# Patient Record
Sex: Female | Born: 1955
Health system: Southern US, Community
[De-identification: ages and names within clinical notes are randomized; demographics above are authoritative.]

## PROBLEM LIST (undated history)

## (undated) ENCOUNTER — Emergency Department (HOSPITAL_BASED_OUTPATIENT_CLINIC_OR_DEPARTMENT_OTHER): Admission: EM | Payer: BLUE CROSS/BLUE SHIELD | Source: Home / Self Care

## (undated) DIAGNOSIS — R0989 Other specified symptoms and signs involving the circulatory and respiratory systems: Secondary | ICD-10-CM

## (undated) DIAGNOSIS — K644 Residual hemorrhoidal skin tags: Secondary | ICD-10-CM

## (undated) DIAGNOSIS — I480 Paroxysmal atrial fibrillation: Secondary | ICD-10-CM

## (undated) DIAGNOSIS — Z8709 Personal history of other diseases of the respiratory system: Secondary | ICD-10-CM

## (undated) DIAGNOSIS — Z973 Presence of spectacles and contact lenses: Secondary | ICD-10-CM

## (undated) HISTORY — PX: COLONOSCOPY: SHX174

## (undated) HISTORY — PX: CARDIAC ELECTROPHYSIOLOGY STUDY AND ABLATION: SHX1294

---

## 2011-03-21 ENCOUNTER — Encounter (INDEPENDENT_AMBULATORY_CARE_PROVIDER_SITE_OTHER): Payer: Self-pay | Admitting: General Surgery

## 2011-03-21 ENCOUNTER — Ambulatory Visit (INDEPENDENT_AMBULATORY_CARE_PROVIDER_SITE_OTHER): Payer: BC Managed Care – PPO | Admitting: General Surgery

## 2011-03-21 DIAGNOSIS — K648 Other hemorrhoids: Secondary | ICD-10-CM

## 2011-03-21 NOTE — Progress Notes (Signed)
Julia Carr is a 55 y.o. female.    Chief Complaint  Patient presents with  . Follow-up    hems    HPI HPI Pt is doing much better after hemorrhoids injected.  She has also been using the hydrocortisone cream 2x/day.  Her itching has been doing better.  She is no longer having trouble cleaning the area as well.  She had immediate relief after the injection.  Past Medical History  Diagnosis Date  . Hemorrhoids     History reviewed. No pertinent past surgical history.  History reviewed. No pertinent family history.  Social History History  Substance Use Topics  . Smoking status: Former Smoker -- 0.5 packs/day  . Smokeless tobacco: Not on file  . Alcohol Use:     Allergies no known allergies  No current outpatient prescriptions on file.    Review of Systems ROS Otherwise negative x11  Physical Exam Physical Exam  Constitutional: She is oriented to person, place, and time. She appears well-developed and well-nourished. No distress.  HENT:  Head: Normocephalic and atraumatic.  Eyes: Pupils are equal, round, and reactive to light. No scleral icterus.  Genitourinary:       Pt has small circumferential external hemorrhoids that are not inflamed except posteriorly where there is a large skin tag that is pedunculated.  Musculoskeletal: Normal range of motion.  Neurological: She is alert and oriented to person, place, and time. Coordination normal.  Skin: Skin is warm and dry. No rash noted. She is not diaphoretic. No erythema. No pallor.  Psychiatric: She has a normal mood and affect. Her behavior is normal. Judgment and thought content normal.     There were no vitals taken for this visit.  Assessment/Plan Internal and external hemorrhoids with large skin tag posteriorly. Follow up for office surgery to excise large posterior skin tag/hemorrhoid.    Andree Golphin 03/21/2011, 12:33 PM

## 2011-04-17 ENCOUNTER — Ambulatory Visit (INDEPENDENT_AMBULATORY_CARE_PROVIDER_SITE_OTHER): Payer: BC Managed Care – PPO | Admitting: General Surgery

## 2012-01-11 ENCOUNTER — Encounter (HOSPITAL_BASED_OUTPATIENT_CLINIC_OR_DEPARTMENT_OTHER): Payer: Self-pay

## 2012-01-11 ENCOUNTER — Emergency Department (HOSPITAL_BASED_OUTPATIENT_CLINIC_OR_DEPARTMENT_OTHER)
Admission: EM | Admit: 2012-01-11 | Discharge: 2012-01-11 | Disposition: A | Discharge status: Home / Self Care | Payer: BC Managed Care – PPO | Attending: Emergency Medicine | Admitting: Emergency Medicine

## 2012-01-11 DIAGNOSIS — Y93G1 Activity, food preparation and clean up: Secondary | ICD-10-CM | POA: Insufficient documentation

## 2012-01-11 DIAGNOSIS — W260XXA Contact with knife, initial encounter: Secondary | ICD-10-CM | POA: Insufficient documentation

## 2012-01-11 DIAGNOSIS — Y998 Other external cause status: Secondary | ICD-10-CM | POA: Insufficient documentation

## 2012-01-11 DIAGNOSIS — Z87891 Personal history of nicotine dependence: Secondary | ICD-10-CM | POA: Insufficient documentation

## 2012-01-11 DIAGNOSIS — S61209A Unspecified open wound of unspecified finger without damage to nail, initial encounter: Secondary | ICD-10-CM | POA: Insufficient documentation

## 2012-01-11 DIAGNOSIS — S61211A Laceration without foreign body of left index finger without damage to nail, initial encounter: Secondary | ICD-10-CM

## 2012-01-11 NOTE — ED Notes (Signed)
Pt presents with laceration to the left index finger.  Pt states that she was making lunch and lacerated the finger with a knife. Tetanus utd.

## 2012-01-11 NOTE — ED Provider Notes (Signed)
Medical screening examination/treatment/procedure(s) were performed by non-physician practitioner and as supervising physician I was immediately available for consultation/collaboration.   Sulma Ruffino, MD 01/11/12 1612 

## 2012-01-11 NOTE — Discharge Instructions (Signed)
Tissue Adhesive Wound Care A wound can be repaired by using tissue adhesive. Tissue adhesive holds the skin together and allows faster healing. It forms a strong bond on the skin in about 1 minute and reaches its full strength in about 2 or 3 minutes. The adhesive disappears naturally while healing. Follow up is required if your caregiver wants to recheck for infection and to make sure your wound is healing properly.  You may need a tetanus shot if:  You cannot remember when you had your last tetanus shot.   You have never had a tetanus shot.   The injury broke your skin.  If you got a tetanus shot, your arm may swell, get red, and feel warm to the touch. This is common and not a problem. If you need a tetanus shot and you choose not to have one, there is a rare chance of getting tetanus. Sickness from tetanus can be serious. HOME CARE INSTRUCTIONS   Only take over-the-counter or prescription medicines for pain, discomfort, or fever as directed by your caregiver.   Showers are allowed. Do not soak the area containing the tissue adhesive. Do not take baths, swim, or use hot tubs. Do not use any soaps or ointments on the wound until it has healed.   If a bandage (dressing) has been applied, follow your caregiver's instructions for how often to change the dressing.   Keep the dressing dry if one has been applied.   Do not scratch, pick, or rub the adhesive.   Do not place tape over the adhesive. The adhesive could come off when pulling the tape off.   Protect the wound from further injury until it is healed.   Protect the wound from sun and tanning bed exposure while it is healing and for several weeks after healing.   Keep all follow-up appointments as directed by your caregiver.  SEEK IMMEDIATE MEDICAL CARE IF:   Your wound becomes red, swollen, hot, or tender.   You have increasing pain in the wound.   You have a red streak that goes away from the wound.   You have pus coming  from the wound.   You have a fever.   You have shaking chills.   There is a bad smell coming from the wound.   The wound or adhesive breaks open.  MAKE SURE YOU:   Understand these instructions.   Will watch your condition.   Will get help right away if you are not doing well or get worse.  Document Released: 02/11/2001 Document Revised: 08/07/2011 Document Reviewed: 12/22/2010 Exit[employer] Patient Information 2012 [employer]. 

## 2012-01-11 NOTE — ED Provider Notes (Signed)
History     CSN: [id number]  Arrival date & time 01/11/12  1224   First MD Initiated Contact with Patient 01/11/12 1245      Chief Complaint  Patient presents with  . Laceration    (Consider location/radiation/quality/duration/timing/severity/associated sxs/prior treatment) Patient is a 56 y.o. female presenting with skin laceration. The history is provided by the patient. No language interpreter was used.  Laceration  The incident occurred less than 1 hour ago. Size: 0.5. The laceration mechanism is unknown.The patient is experiencing no pain. She reports no foreign bodies present. Her tetanus status is UTD.    Past Medical History  Diagnosis Date  . Hemorrhoids   . Pneumothorax     History reviewed. No pertinent past surgical history.  History reviewed. No pertinent family history.  History  Substance Use Topics  . Smoking status: Former Smoker -- 0.5 packs/day  . Smokeless tobacco: Never Used  . Alcohol Use: No    OB History    Grav Para Term Preterm Abortions TAB SAB Ect Mult Living                  Review of Systems  Skin: Positive for wound.  All other systems reviewed and are negative.    Allergies  Latex  Home Medications   Current Outpatient Rx  Name Route Sig Dispense Refill  . FLAXSEED OIL PO Oral Take by mouth.      . MULTIVITAMIN PO Oral Take by mouth.        BP 108/75  Pulse 60  Temp(Src) 97.7 F (36.5 C) (Oral)  Resp 20  Ht 5\' 8"  (1.727 m)  Wt 150 lb (68.04 kg)  BMI 22.81 kg/m2  SpO2 100%  Physical Exam  Vitals reviewed. Constitutional: She is oriented to person, place, and time. She appears well-developed and well-nourished.  Musculoskeletal: She exhibits tenderness.  Neurological: She is alert and oriented to person, place, and time. She has normal reflexes.  Skin: Skin is dry.       5mm  Psychiatric: She has a normal mood and affect.    ED Course  LACERATION REPAIR Date/Time: 01/11/2012 1:28 PM Performed by: Elson Areas Authorized by: Elson Areas Consent: Verbal consent not obtained. Consent given by: patient Patient understanding: patient does not state understanding of the procedure being performed Time out: Immediately prior to procedure a "time out" was called to verify the correct patient, procedure, equipment, support staff and site/side marked as required. Laceration length: 0.5 cm Foreign bodies: no foreign bodies Tendon involvement: none Nerve involvement: none Preparation: Patient was prepped and draped in the usual sterile fashion. Skin closure: glue Approximation: close Patient tolerance: Patient tolerated the procedure well with no immediate complications.   (including critical care time)  Labs Reviewed - No data to display No results found.   No diagnosis found.    MDM          Elson Areas, PA 01/11/12 1330

## 2012-07-14 LAB — HM DEXA SCAN

## 2012-07-14 LAB — HM PAP SMEAR: HM Pap smear: NORMAL

## 2012-07-14 LAB — HM MAMMOGRAPHY: HM Mammogram: NORMAL

## 2014-05-13 ENCOUNTER — Other Ambulatory Visit: Payer: Self-pay | Admitting: Internal Medicine

## 2014-07-10 ENCOUNTER — Encounter: Payer: Self-pay | Admitting: Family

## 2014-07-10 ENCOUNTER — Ambulatory Visit (INDEPENDENT_AMBULATORY_CARE_PROVIDER_SITE_OTHER): Payer: BC Managed Care – PPO | Admitting: Family

## 2014-07-10 VITALS — BP 122/82 | HR 48 | Temp 97.7°F | Resp 18 | Ht 68.0 in | Wt 142.8 lb

## 2014-07-10 DIAGNOSIS — R634 Abnormal weight loss: Secondary | ICD-10-CM

## 2014-07-10 DIAGNOSIS — Z8709 Personal history of other diseases of the respiratory system: Secondary | ICD-10-CM

## 2014-07-10 DIAGNOSIS — I4891 Unspecified atrial fibrillation: Secondary | ICD-10-CM

## 2014-07-10 NOTE — Patient Instructions (Signed)
Please schedule your fasting physical at the front desk.   Welcome to [facility]!

## 2014-07-10 NOTE — Progress Notes (Signed)
Subjective:    Patient ID: Julia Carr, female    DOB: 1955/10/18, 58 y.o.   MRN: [id number] Coral12  HPI  Ms. Julia Carr is a 58 year old female who presents today to establish care.   Atrial Flutter/Fib- reports that she was admitted to [facility] x 4 nights She is maintained on amiodarone, diltiazem.  She is followed by Dr. Clydie Braunavid Fitzgerald for cardiology.  She is scheduled for cardiac ablation.    History of pneumothorax- occurred spontaneous and had a chest tube placed overnight. No issues since that time.      Review of Systems  Constitutional:       Reports 10 pound weight loss. Reports thyroid studies were negative  HENT: Negative for hearing loss and rhinorrhea.   Eyes: Negative for visual disturbance.  Respiratory: Negative for cough.   Cardiovascular: Negative for chest pain.       Intermittent palpitations  Gastrointestinal: Negative for nausea, vomiting and diarrhea.       Occasional gerd symptoms.  Genitourinary: Negative for dysuria, frequency and hematuria.  Musculoskeletal: Negative for myalgias and arthralgias.  Skin: Negative for rash.  Neurological: Negative for headaches.  Hematological: Negative for adenopathy.  Psychiatric/Behavioral:       Denies depression/anxiety   Past Medical History  Diagnosis Date  . Hemorrhoids   . Pneumothorax   . History of chicken pox   . Atrial fibrillation     History   Social History  . Marital Status: Single    Spouse Name: N/A    Number of Children: N/A  . Years of Education: N/A   Occupational History  . Not on file.   Social History Main Topics  . Smoking status: Former Smoker -- 0.50 packs/day  . Smokeless tobacco: Never Used  . Alcohol Use: No  . Drug Use: No  . Sexual Activity: Not on file   Other Topics Concern  . 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    One dog    Past Surgical History   Procedure Laterality Date  . Chest tube insertion      Family History  Problem Relation Age of Onset  . Cancer Mother     ovarian  . Diabetes Mother     type II  . Alcohol abuse Father   . Hypertension Father   . Heart murmur Sister   . Cancer Paternal Aunt     breast  . Cancer Paternal Grandmother     breast  . Heart disease Paternal Grandmother   . Alcohol abuse Paternal Grandfather     Allergies  Allergen Reactions  . Latex Hives    Current Outpatient Prescriptions on File Prior to Visit  Medication Sig Dispense Refill  . Flaxseed, Linseed, (FLAXSEED OIL PO) Take by mouth.      . Multiple Vitamin (MULTIVITAMIN PO) Take by mouth.       No current facility-administered medications on file prior to visit.    BP 122/82 mmHg  Pulse 48  Temp(Src) 97.7 F (36.5 C) (Oral)  Resp 18  Ht 5\' 8"  (1.727 m)  Wt 142 lb 12.8 oz (64.774 kg)  BMI 21.72 kg/m2  SpO2 100%       Objective:   Physical Exam  Constitutional: She is oriented to person, place, and time. She appears well-developed and well-nourished. No distress.  HENT:  Head: Normocephalic  and atraumatic.  Right Ear: Tympanic membrane and ear canal normal.  Left Ear: Tympanic membrane and ear canal normal.  Mouth/Throat: No oropharyngeal exudate, posterior oropharyngeal edema or posterior oropharyngeal erythema.  Cardiovascular: Normal rate and regular rhythm.   No murmur heard. Pulmonary/Chest: Effort normal and breath sounds normal. No respiratory distress. She has no wheezes. She has no rales. She exhibits no tenderness.  Musculoskeletal: She exhibits no edema.  Neurological: She is alert and oriented to person, place, and time.  Skin: Skin is warm and dry.  Psychiatric: She has a normal mood and affect. Her behavior is normal. Judgment and thought content normal.          Assessment & Plan:

## 2014-07-13 DIAGNOSIS — I4891 Unspecified atrial fibrillation: Secondary | ICD-10-CM

## 2014-07-13 DIAGNOSIS — Z8709 Personal history of other diseases of the respiratory system: Secondary | ICD-10-CM | POA: Insufficient documentation

## 2014-07-13 DIAGNOSIS — R634 Abnormal weight loss: Secondary | ICD-10-CM

## 2014-07-13 HISTORY — DX: Unspecified atrial fibrillation: I48.91

## 2014-07-13 HISTORY — DX: Abnormal weight loss: R63.4

## 2014-07-13 NOTE — Assessment & Plan Note (Signed)
Plan to repeat TSH at her upcoming physical.

## 2014-07-13 NOTE — Assessment & Plan Note (Signed)
Rate stable, management by cardiology.  She is currently scheduled for ablation with Dr. Clydie Braunavid Fitzgerald.

## 2014-08-04 ENCOUNTER — Encounter: Payer: Self-pay | Admitting: Family

## 2014-08-04 ENCOUNTER — Ambulatory Visit (INDEPENDENT_AMBULATORY_CARE_PROVIDER_SITE_OTHER): Payer: BC Managed Care – PPO | Admitting: Family

## 2014-08-04 VITALS — BP 110/80 | HR 76 | Temp 97.7°F | Resp 16 | Ht 68.0 in | Wt 142.6 lb

## 2014-08-04 DIAGNOSIS — Z0000 Encounter for general adult medical examination without abnormal findings: Secondary | ICD-10-CM | POA: Insufficient documentation

## 2014-08-04 HISTORY — DX: Encounter for general adult medical examination without abnormal findings: Z00.00

## 2014-08-04 NOTE — Assessment & Plan Note (Signed)
Obtain UA, LFT, CBC, lipid. Continue healthy diet, exercise, refer for mammo and dexa.

## 2014-08-04 NOTE — Progress Notes (Signed)
Subjective:    Patient ID: Julia CoralNancy Carr, female    DOB: Apr 26, 1956, 58 y.o.   MRN: [id number]  HPI  Julia Carr is a 58 yr old female who presents today for cpx.  Immunizations: declines flu shot, tdap <4 yrs ago. Diet: healthy Exercise: active/walks daily Colonoscopy: normal 4/14 advised 5 year follow up. Dexa: due Pap Smear: up to date due next year Mammogram: due    Review of Systems  Constitutional: Negative for unexpected weight change.  HENT: Negative for hearing loss.   Eyes: Negative for visual disturbance.  Respiratory: Negative for shortness of breath.   Cardiovascular: Negative for chest pain.  Gastrointestinal: Negative for nausea, vomiting and diarrhea.  Genitourinary: Negative for dysuria and frequency.  Musculoskeletal: Negative for myalgias and arthralgias.  Skin: Negative for rash.  Hematological: Bruises/bleeds easily.  Psychiatric/Behavioral:       Denies depression/anxiety   Past Medical History  Diagnosis Date  . Hemorrhoids   . Pneumothorax   . History of chicken pox   . Atrial fibrillation     History   Social History  . Marital Status: Single    Spouse Name: N/A    Number of Children: N/A  . Years of Education: N/A   Occupational History  . Not on file.   Social History Main Topics  . Smoking status: Former Smoker -- 0.50 packs/day  . Smokeless tobacco: Never Used  . Alcohol Use: No  . Drug Use: No  . Sexual Activity: Not on file   Other Topics Concern  . 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    One dog    Past Surgical History  Procedure Laterality Date  . Chest tube insertion      Family History  Problem Relation Age of Onset  . Cancer Mother     ovarian  . Diabetes Mother     type II  . Alcohol abuse Father   . Hypertension Father   . Heart murmur Sister   . Cancer Paternal Aunt     breast    . Cancer Paternal Grandmother     breast  . Heart disease Paternal Grandmother   . Alcohol abuse Paternal Grandfather     Allergies  Allergen Reactions  . Latex Hives    Current Outpatient Prescriptions on File Prior to Visit  Medication Sig Dispense Refill  . amiodarone (PACERONE) 200 MG tablet Take 200 mg by mouth daily.    . Flaxseed, Linseed, (FLAXSEED OIL PO) Take by mouth.      Marland Kitchen. lisinopril (PRINIVIL,ZESTRIL) 5 MG tablet Take 5 mg by mouth daily.    . Multiple Vitamin (MULTIVITAMIN PO) Take by mouth.       No current facility-administered medications on file prior to visit.    BP 110/80 mmHg  Pulse 76  Temp(Src) 97.7 F (36.5 C) (Oral)  Resp 16  Ht 5\' 8"  (1.727 m)  Wt 142 lb 9.6 oz (64.683 kg)  BMI 21.69 kg/m2       Objective:   Physical Exam  Physical Exam  Constitutional: She is oriented to person, place, and time. She appears well-developed and well-nourished. No distress.  HENT:  Head: Normocephalic and atraumatic.  Right Ear: Tympanic membrane and ear canal normal.  Left Ear: Tympanic membrane and ear canal normal.  Mouth/Throat: Oropharynx is clear and moist.  Eyes: Pupils  are equal, round, and reactive to light. No scleral icterus.  Neck: Normal range of motion. No thyromegaly present.  Cardiovascular: Normal rate and regular rhythm.   No murmur heard. Pulmonary/Chest: Effort normal and breath sounds normal. No respiratory distress. He has no wheezes. She has no rales. She exhibits no tenderness.  Abdominal: Soft. Bowel sounds are normal. He exhibits no distension and no mass. There is no tenderness. There is no rebound and no guarding.  Musculoskeletal: She exhibits no edema.  Lymphadenopathy:    She has no cervical adenopathy.  Neurological: She is alert and oriented to person, place, and time. She has normal patellar reflexes. She exhibits normal muscle tone. Coordination normal.  Skin: Skin is warm and dry.  Psychiatric: She has a normal mood  and affect. Her behavior is normal. Judgment and thought content normal.  Breasts: Examined lying Right: Without masses, retractions, discharge or axillary adenopathy.  Left: Without masses, retractions, discharge or axillary adenopathy.  Pelvic:  deferred        Assessment & Plan:         Assessment & Plan:

## 2014-08-06 LAB — URINALYSIS
BILIRUBIN URINE: NEGATIVE
HGB URINE DIPSTICK: NEGATIVE
KETONES UR: NEGATIVE
LEUKOCYTES UA: NEGATIVE
NITRITE: NEGATIVE
Specific Gravity, Urine: 1.015 (ref 1.000–1.030)
TOTAL PROTEIN, URINE-UPE24: NEGATIVE
Urine Glucose: NEGATIVE
Urobilinogen, UA: 0.2 — AB (ref 0.0–1.0)
pH: 7 (ref 5.0–8.0)

## 2014-08-06 LAB — CBC WITH DIFFERENTIAL/PLATELET
BASOS ABS: 0 10*3/uL (ref 0.0–0.1)
Basophils Relative: 0.7 % (ref 0.0–3.0)
Eosinophils Absolute: 0.1 10*3/uL (ref 0.0–0.7)
Eosinophils Relative: 1.8 % (ref 0.0–5.0)
HEMATOCRIT: 38.7 % (ref 36.0–46.0)
Hemoglobin: 12.6 g/dL (ref 12.0–15.0)
LYMPHS ABS: 1.3 10*3/uL (ref 0.7–4.0)
Lymphocytes Relative: 28.6 % (ref 12.0–46.0)
MCHC: 32.5 g/dL (ref 30.0–36.0)
MCV: 89.7 fl (ref 78.0–100.0)
Monocytes Absolute: 0.3 10*3/uL (ref 0.1–1.0)
Monocytes Relative: 7.2 % (ref 3.0–12.0)
NEUTROS ABS: 2.7 10*3/uL (ref 1.4–7.7)
Neutrophils Relative %: 61.7 % (ref 43.0–77.0)
PLATELETS: 255 10*3/uL (ref 150.0–400.0)
RBC: 4.32 Mil/uL (ref 3.87–5.11)
RDW: 17.5 % — AB (ref 11.5–15.5)
WBC: 4.4 10*3/uL (ref 4.0–10.5)

## 2014-08-06 LAB — LIPID PANEL
Cholesterol: 146 mg/dL (ref 0–200)
HDL: 51.8 mg/dL (ref >39.00)
LDL CALC: 84 mg/dL (ref 0–99)
NONHDL: 94.2
Total CHOL/HDL Ratio: 3
Triglycerides: 52 mg/dL (ref 0.0–149.0)
VLDL: 10.4 mg/dL (ref 0.0–40.0)

## 2014-08-06 LAB — HEPATIC FUNCTION PANEL
ALBUMIN: 4.3 g/dL (ref 3.5–5.2)
ALT: 24 U/L (ref 0–35)
AST: 21 U/L (ref 0–37)
Alkaline Phosphatase: 61 U/L (ref 39–117)
Bilirubin, Direct: 0.1 mg/dL (ref 0.0–0.3)
TOTAL PROTEIN: 7.4 g/dL (ref 6.0–8.3)
Total Bilirubin: 0.8 mg/dL (ref 0.2–1.2)

## 2014-08-08 ENCOUNTER — Encounter: Payer: Self-pay | Admitting: Family

## 2014-08-15 ENCOUNTER — Ambulatory Visit (HOSPITAL_BASED_OUTPATIENT_CLINIC_OR_DEPARTMENT_OTHER)
Admission: RE | Admit: 2014-08-15 | Discharge: 2014-08-15 | Disposition: A | Discharge status: Home / Self Care | Payer: BC Managed Care – PPO | Source: Ambulatory Visit | Attending: Family | Admitting: Family

## 2014-08-15 DIAGNOSIS — Z1231 Encounter for screening mammogram for malignant neoplasm of breast: Secondary | ICD-10-CM | POA: Insufficient documentation

## 2014-08-15 DIAGNOSIS — Z0000 Encounter for general adult medical examination without abnormal findings: Secondary | ICD-10-CM

## 2014-08-18 ENCOUNTER — Inpatient Hospital Stay: Admission: RE | Admit: 2014-08-18 | Payer: BC Managed Care – PPO | Source: Ambulatory Visit

## 2014-08-21 ENCOUNTER — Telehealth: Payer: Self-pay | Admitting: Family

## 2014-08-21 NOTE — Telephone Encounter (Signed)
Caller name: Galit Relation to pt: self Call back number: [phone]  [facility] Pck to leave message Pharmacy:  Reason for call:   Patient wanting to know if we received lab test results from Dr. Sampson GoonFitzgerald and what labs did they send.

## 2014-08-21 NOTE — Telephone Encounter (Signed)
Julia Carr-- do you have any results on this pt?

## 2014-08-31 NOTE — Telephone Encounter (Signed)
We did received records and I placed them on your desk.

## 2014-09-11 ENCOUNTER — Encounter: Payer: Self-pay | Admitting: Family

## 2014-09-11 NOTE — Telephone Encounter (Signed)
Records [facility] from 01/06/11 through 06/23/13. Did not receive EKG or mammogram results that were noted in records. Spoke with pt re: below request and she states Dr Sampson GoonFitzgerald is her cardiologist and did a full panel of blood tests prior to her CPE here. She reports that it appeared we also did a full panel of labs and she states we would not need to obtain labs from Dr Sampson GoonFitzgerald. She will contact [facility] re: EKG and mammogram results and request them be faxed to us.

## 2014-09-13 ENCOUNTER — Encounter: Payer: Self-pay | Admitting: Family

## 2014-09-13 DIAGNOSIS — M8580 Other specified disorders of bone density and structure, unspecified site: Secondary | ICD-10-CM

## 2014-09-13 HISTORY — DX: Other specified disorders of bone density and structure, unspecified site: M85.80

## 2014-09-15 NOTE — Telephone Encounter (Signed)
EKG and mammogram results received and placed in Julia Carr's yellow folder. JG//CMA

## 2014-09-19 ENCOUNTER — Encounter: Payer: Self-pay | Admitting: Family

## 2014-09-25 ENCOUNTER — Encounter: Payer: Self-pay | Admitting: Family

## 2015-01-01 ENCOUNTER — Telehealth: Payer: Self-pay | Admitting: Family

## 2015-01-01 NOTE — Telephone Encounter (Addendum)
Relation to pt: self  Call back number: [phone]   Reason for call: pt requesting a letter requesting a stand alone work station at work due to the kinks in her neck and back. Please fax to   [phone]

## 2015-01-02 NOTE — Telephone Encounter (Signed)
Left detailed message on cell# that pt will need evaluation in the office before recommendations can be made and to call and schedule appt.

## 2015-01-03 ENCOUNTER — Encounter: Payer: Self-pay | Admitting: Family

## 2015-01-03 ENCOUNTER — Ambulatory Visit (INDEPENDENT_AMBULATORY_CARE_PROVIDER_SITE_OTHER): Payer: BLUE CROSS/BLUE SHIELD | Admitting: Family

## 2015-01-03 VITALS — BP 120/88 | HR 59 | Temp 97.9°F | Resp 16 | Ht 68.0 in | Wt 144.0 lb

## 2015-01-03 DIAGNOSIS — I10 Essential (primary) hypertension: Secondary | ICD-10-CM | POA: Diagnosis not present

## 2015-01-03 DIAGNOSIS — M542 Cervicalgia: Secondary | ICD-10-CM | POA: Insufficient documentation

## 2015-01-03 HISTORY — DX: Essential (primary) hypertension: I10

## 2015-01-03 HISTORY — DX: Cervicalgia: M54.2

## 2015-01-03 NOTE — Assessment & Plan Note (Addendum)
New. I do think an adjustable height desk will be helpful for her and I have provided her with a note for her employer.

## 2015-01-03 NOTE — Progress Notes (Signed)
Pre visit review using our clinic review tool, if applicable. No additional management support is needed unless otherwise documented below in the visit note. 

## 2015-01-03 NOTE — Patient Instructions (Signed)
OK to remain off of lisinopril. Please schedule a follow up appointment in 3 months.

## 2015-01-03 NOTE — Progress Notes (Signed)
   Subjective:    Patient ID: Julia CoralNancy Carr, female    DOB: 10-Dec-1955, 59 y.o.   MRN: [id number]  HPI  Ms. Julia Carr is a 59 yr old female who presents today to discuss neck pain.  Reports that she gets  "pain" at the left neck base/upper shoulder intermittently. Attributes to positioning at her desk at work.  Her employer will provide her with an adjustable height desk, however she needs a note from her provider which she is requesting today.  She is currently off of lisinopril. Feeling well. BP Readings from Last 3 Encounters:  01/03/15 120/88  08/04/14 110/80  07/10/14 122/82  118/78 manual recheck.     Review of Systems See HPI  Past Medical History  Diagnosis Date  . Hemorrhoids   . Pneumothorax   . History of chicken pox   . Atrial fibrillation   . HTN (hypertension)     History   Social History  . Marital Status: Single    Spouse Name: N/A  . Number of Children: N/A  . Years of Education: N/A   Occupational History  . Not on file.   Social History Main Topics  . Smoking status: Former Smoker -- 0.50 packs/day  . Smokeless tobacco: Never Used  . Alcohol Use: No  . Drug Use: No  . Sexual Activity: Not on file   Other Topics Concern  . 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    One dog    Past Surgical History  Procedure Laterality Date  . Chest tube insertion      Family History  Problem Relation Age of Onset  . Cancer Mother     ovarian  . Diabetes Mother     type II  . Alcohol abuse Father   . Hypertension Father   . Heart murmur Sister   . Cancer Paternal Aunt     breast  . Cancer Paternal Grandmother     breast  . Heart disease Paternal Grandmother   . Alcohol abuse Paternal Grandfather     Allergies  Allergen Reactions  . Latex Hives    Current Outpatient Prescriptions on File Prior to Visit  Medication Sig  Dispense Refill  . diltiazem (CARDIZEM CD) 180 MG 24 hr capsule Take 180 mg by mouth daily.    . Flaxseed, Linseed, (FLAXSEED OIL PO) Take by mouth.      . Multiple Vitamin (MULTIVITAMIN PO) Take by mouth.       No current facility-administered medications on file prior to visit.    BP 120/88 mmHg  Pulse 59  Temp(Src) 97.9 F (36.6 C) (Oral)  Resp 16  Ht 5\' 8"  (1.727 m)  Wt 144 lb (65.318 kg)  BMI 21.90 kg/m2  SpO2 99%       Objective:   Physical Exam  Constitutional: She appears well-developed and well-nourished.  Neck: Neck supple.  Cardiovascular: Normal rate, regular rhythm and normal heart sounds.   No murmur heard. Pulmonary/Chest: Effort normal and breath sounds normal. No respiratory distress. She has no wheezes.  Musculoskeletal:  No neck tenderness to palpation  Psychiatric: She has a normal mood and affect. Her behavior is normal. Judgment and thought content normal.          Assessment & Plan:

## 2015-01-03 NOTE — Assessment & Plan Note (Signed)
BP is stable despite discontinuation of lisinopril.  OK to remain off.

## 2015-03-08 ENCOUNTER — Ambulatory Visit: Payer: BLUE CROSS/BLUE SHIELD | Admitting: Medical

## 2015-03-09 ENCOUNTER — Ambulatory Visit: Payer: BLUE CROSS/BLUE SHIELD | Admitting: Medical

## 2015-03-12 ENCOUNTER — Encounter: Payer: Self-pay | Admitting: Medical

## 2015-03-12 ENCOUNTER — Telehealth: Payer: Self-pay | Admitting: Medical

## 2015-03-12 NOTE — Telephone Encounter (Signed)
charge 

## 2015-03-12 NOTE — Telephone Encounter (Signed)
Pt was no show 03/09/15 9:00am, acute appt, pt has not rescheduled, has appt with primary 04/13/15, mailing letter, do you want to charge no show fee?

## 2015-04-13 ENCOUNTER — Ambulatory Visit: Payer: Self-pay | Admitting: Family

## 2015-05-11 ENCOUNTER — Ambulatory Visit: Payer: Self-pay | Admitting: Family

## 2015-07-17 ENCOUNTER — Encounter: Payer: Self-pay | Admitting: Behavioral Health

## 2015-07-17 ENCOUNTER — Telehealth: Payer: Self-pay | Admitting: Behavioral Health

## 2015-07-17 NOTE — Telephone Encounter (Signed)
Unable to reach patient at time of Pre-Visit Call.  Left message for patient to return call when available.    

## 2015-07-17 NOTE — Telephone Encounter (Signed)
Patient returned the call, but declined to go over Pre-visit info. She stated, " this is a waste of my time and I will wait and do this at my appointment tomorrow, thank you".

## 2015-07-18 ENCOUNTER — Other Ambulatory Visit (HOSPITAL_COMMUNITY)
Admission: RE | Admit: 2015-07-18 | Discharge: 2015-07-18 | Disposition: A | Discharge status: Home / Self Care | Payer: BLUE CROSS/BLUE SHIELD | Source: Ambulatory Visit | Attending: Family | Admitting: Family

## 2015-07-18 ENCOUNTER — Encounter: Payer: Self-pay | Admitting: Family

## 2015-07-18 ENCOUNTER — Ambulatory Visit (INDEPENDENT_AMBULATORY_CARE_PROVIDER_SITE_OTHER): Payer: BLUE CROSS/BLUE SHIELD | Admitting: Family

## 2015-07-18 VITALS — BP 110/64 | HR 64 | Temp 97.5°F | Resp 16 | Ht 68.0 in | Wt 146.3 lb

## 2015-07-18 DIAGNOSIS — Z1151 Encounter for screening for human papillomavirus (HPV): Secondary | ICD-10-CM | POA: Diagnosis present

## 2015-07-18 DIAGNOSIS — Z01419 Encounter for gynecological examination (general) (routine) without abnormal findings: Secondary | ICD-10-CM | POA: Diagnosis not present

## 2015-07-18 DIAGNOSIS — Z0000 Encounter for general adult medical examination without abnormal findings: Secondary | ICD-10-CM

## 2015-07-18 LAB — URINALYSIS, ROUTINE W REFLEX MICROSCOPIC
Bilirubin Urine: NEGATIVE
HGB URINE DIPSTICK: NEGATIVE
Ketones, ur: NEGATIVE
LEUKOCYTES UA: NEGATIVE
NITRITE: NEGATIVE
Specific Gravity, Urine: 1.015 (ref 1.000–1.030)
Total Protein, Urine: NEGATIVE
URINE GLUCOSE: NEGATIVE
Urobilinogen, UA: 0.2 (ref 0.0–1.0)
pH: 8 (ref 5.0–8.0)

## 2015-07-18 LAB — TSH: TSH: 2.55 u[IU]/mL (ref 0.35–4.50)

## 2015-07-18 LAB — BASIC METABOLIC PANEL
BUN: 17 mg/dL (ref 6–23)
CALCIUM: 9.7 mg/dL (ref 8.4–10.5)
CO2: 30 mEq/L (ref 19–32)
Chloride: 106 mEq/L (ref 96–112)
Creatinine, Ser: 0.74 mg/dL (ref 0.40–1.20)
GFR: 85.84 mL/min (ref >60.00)
GLUCOSE: 82 mg/dL (ref 70–99)
Potassium: 4.1 mEq/L (ref 3.5–5.1)
SODIUM: 143 meq/L (ref 135–145)

## 2015-07-18 LAB — LIPID PANEL
Cholesterol: 165 mg/dL (ref 0–200)
HDL: 48.1 mg/dL (ref >39.00)
LDL Cholesterol: 99 mg/dL (ref 0–99)
NONHDL: 116.71
TRIGLYCERIDES: 88 mg/dL (ref 0.0–149.0)
Total CHOL/HDL Ratio: 3
VLDL: 17.6 mg/dL (ref 0.0–40.0)

## 2015-07-18 LAB — CBC WITH DIFFERENTIAL/PLATELET
BASOS ABS: 0 10*3/uL (ref 0.0–0.1)
Basophils Relative: 0.5 % (ref 0.0–3.0)
Eosinophils Absolute: 0.1 10*3/uL (ref 0.0–0.7)
Eosinophils Relative: 1.8 % (ref 0.0–5.0)
HCT: 39.5 % (ref 36.0–46.0)
Hemoglobin: 13 g/dL (ref 12.0–15.0)
LYMPHS ABS: 1.7 10*3/uL (ref 0.7–4.0)
Lymphocytes Relative: 34 % (ref 12.0–46.0)
MCHC: 33 g/dL (ref 30.0–36.0)
MCV: 89.3 fl (ref 78.0–100.0)
MONO ABS: 0.5 10*3/uL (ref 0.1–1.0)
MONOS PCT: 9.4 % (ref 3.0–12.0)
NEUTROS PCT: 54.3 % (ref 43.0–77.0)
NRBC: 0 /100{WBCs} (ref 0–4)
Neutro Abs: 2.7 10*3/uL (ref 1.4–7.7)
PLATELETS: 273 10*3/uL (ref 150.0–400.0)
RBC: 4.43 Mil/uL (ref 3.87–5.11)
RDW: 12.8 % (ref 11.5–15.5)
WBC: 5.1 10*3/uL (ref 4.0–10.5)

## 2015-07-18 LAB — HEPATIC FUNCTION PANEL
ALK PHOS: 89 U/L (ref 39–117)
ALT: 21 U/L (ref 0–35)
AST: 23 U/L (ref 0–37)
Albumin: 4.2 g/dL (ref 3.5–5.2)
BILIRUBIN DIRECT: 0.1 mg/dL (ref 0.0–0.3)
BILIRUBIN TOTAL: 0.7 mg/dL (ref 0.2–1.2)
Total Protein: 7.6 g/dL (ref 6.0–8.3)

## 2015-07-18 NOTE — Assessment & Plan Note (Signed)
Encouraged pt to continue healthy diet, exercise.  Declines flu shot and dexa.  Pap performed today.

## 2015-07-18 NOTE — Progress Notes (Signed)
Pre visit review using our clinic review tool, if applicable. No additional management support is needed unless otherwise documented below in the visit note. 

## 2015-07-18 NOTE — Patient Instructions (Addendum)
Keep up the great work with healthy diet and exercise!

## 2015-07-18 NOTE — Addendum Note (Signed)
Addended by: Mervin KungFERGERSON, Brigid Vandekamp A on: 07/18/2015 08:32 AM   Modules accepted: Orders

## 2015-07-18 NOTE — Progress Notes (Signed)
Subjective:    Patient ID: Julia Carr, female    DOB: July 04, 1956, 59 y.o.   MRN: [id number]  HPI  Ms. Julia Carr is a 59 yr old female who presents today for cpx.  Immunizations:  tetanus up to date. Declines flu shot Diet:  Reports healthy diet Exercise: started running  Colonoscopy: 2014- neg for polyps, internal hemorrhoids per pt.  Was told 5 yr follow up.  Dexa: 2013 , declines dexa at this time Pap Smear:  11/13- due, always normal Mammogram: 11/16- done work- awaiting results.    Review of Systems  Constitutional: Negative for unexpected weight change.       Wt Readings from Last 3 Encounters: 07/18/15 : 146 lb 4.8 oz (66.361 kg) 01/03/15 : 144 lb (65.318 kg) 08/04/14 : 142 lb 9.6 oz (64.683 kg)    HENT: Negative for hearing loss and rhinorrhea.   Eyes: Negative for visual disturbance.  Respiratory: Negative for cough and shortness of breath.   Cardiovascular: Negative for chest pain.  Gastrointestinal: Negative for diarrhea, constipation and blood in stool.  Genitourinary: Negative for dysuria and frequency.  Musculoskeletal: Negative for myalgias and arthralgias.  Skin: Negative for rash.  Neurological: Negative for headaches.  Hematological: Negative for adenopathy.  Psychiatric/Behavioral:       Denies depression/anxiety       Past Medical History  Diagnosis Date  . Hemorrhoids   . Pneumothorax   . History of chicken pox   . Atrial fibrillation (HCC)   . HTN (hypertension)     Social History   Social History  . Marital Status: Single    Spouse Name: N/A  . Number of Children: N/A  . Years of Education: N/A   Occupational History  . Not on file.   Social History Main Topics  . Smoking status: Former Smoker -- 0.50 packs/day  . Smokeless tobacco: Never Used  . Alcohol Use: No  . Drug Use: No  . Sexual Activity: Not on file   Other Topics Concern  . 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    One dog    Past Surgical History  Procedure Laterality Date  . Chest tube insertion      Family History  Problem Relation Age of Onset  . Cancer Mother     ovarian  . Diabetes Mother     type II  . Alcohol abuse Father   . Hypertension Father   . Heart murmur Sister   . Cancer Paternal Aunt     breast  . Cancer Paternal Grandmother     breast  . Heart disease Paternal Grandmother   . Alcohol abuse Paternal Grandfather     Allergies  Allergen Reactions  . Latex Hives    Current Outpatient Prescriptions on File Prior to Visit  Medication Sig Dispense Refill  . Flaxseed, Linseed, (FLAXSEED OIL PO) Take by mouth.      . Multiple Vitamin (MULTIVITAMIN PO) Take by mouth.       No current facility-administered medications on file prior to visit.    BP 110/64 mmHg  Pulse 64  Temp(Src) 97.5 F (36.4 C) (Oral)  Resp 16  Ht 5\' 8"  (1.727 m)  Wt 146 lb 4.8 oz (66.361 kg)  BMI 22.25 kg/m2  SpO2 100%    Objective:   Physical Exam Physical Exam  Constitutional: She  is oriented to person, place, and time. She appears well-developed and well-nourished. No distress.  HENT:  Head: Normocephalic and atraumatic.  Right Ear: Tympanic membrane and ear canal normal.  Left Ear: Tympanic membrane and ear canal normal.  Mouth/Throat: Oropharynx is clear and moist.  Eyes: Pupils are equal, round, and reactive to light. No scleral icterus.  Neck: Normal range of motion. No thyromegaly present.  Cardiovascular: Normal rate and regular rhythm.   No murmur heard. Pulmonary/Chest: Effort normal and breath sounds normal. No respiratory distress. She has no wheezes. She has no rales. She exhibits no tenderness.  Abdominal: Soft. Bowel sounds are normal. He exhibits no distension and no mass. There is no tenderness. There is no rebound and no guarding.  Musculoskeletal: She exhibits no edema.  Lymphadenopathy:      She has no cervical adenopathy.  Neurological: She is alert and oriented to person, place, and time. She has normal patellar reflexes. She exhibits normal muscle tone. Coordination normal.  Skin: Skin is warm and dry.  Psychiatric: She has a normal mood and affect. Her behavior is normal. Judgment and thought content normal.  Breasts: Examined lying Right: Without masses, retractions, discharge or axillary adenopathy.  Left: Without masses, retractions, discharge or axillary adenopathy.  Inguinal/mons: Normal without inguinal adenopathy  External genitalia: Normal  BUS/Urethra/Skene's glands: Normal  Bladder: Normal  Vagina: Normal  Cervix: Normal  Uterus: normal in size, shape and contour. Midline and mobile  Adnexa/parametria:  Rt: Without masses or tenderness.  Lt: Without masses or tenderness.  Anus and perineum: + external hemorrhoids noted           Assessment & Plan:        Assessment & Plan:

## 2015-07-20 LAB — CYTOLOGY - PAP

## 2015-07-22 ENCOUNTER — Encounter: Payer: Self-pay | Admitting: Family

## 2015-09-28 ENCOUNTER — Telehealth: Payer: Self-pay | Admitting: Family

## 2015-09-28 NOTE — Telephone Encounter (Signed)
Health maintenance updated

## 2015-09-28 NOTE — Telephone Encounter (Signed)
Patient declined Flu Shot °

## 2016-07-11 ENCOUNTER — Ambulatory Visit: Payer: BLUE CROSS/BLUE SHIELD | Admitting: Family

## 2016-07-28 ENCOUNTER — Encounter: Payer: BLUE CROSS/BLUE SHIELD | Admitting: Family

## 2016-08-11 ENCOUNTER — Encounter: Payer: Self-pay | Admitting: Family

## 2016-08-11 ENCOUNTER — Ambulatory Visit (INDEPENDENT_AMBULATORY_CARE_PROVIDER_SITE_OTHER): Payer: BLUE CROSS/BLUE SHIELD | Admitting: Family

## 2016-08-11 VITALS — BP 136/82 | HR 70 | Temp 97.8°F | Resp 16 | Ht 68.0 in | Wt 148.4 lb

## 2016-08-11 DIAGNOSIS — E2839 Other primary ovarian failure: Secondary | ICD-10-CM

## 2016-08-11 DIAGNOSIS — Z0000 Encounter for general adult medical examination without abnormal findings: Secondary | ICD-10-CM | POA: Diagnosis not present

## 2016-08-11 DIAGNOSIS — Z1239 Encounter for other screening for malignant neoplasm of breast: Secondary | ICD-10-CM

## 2016-08-11 MED ORDER — CALCIUM CARBONATE-VITAMIN D 600-400 MG-UNIT PO TABS: 1.0000 | ORAL_TABLET | Freq: Two times a day (BID) | ORAL | Status: DC

## 2016-08-11 NOTE — Progress Notes (Signed)
Pre visit review using our clinic review tool, if applicable. No additional management support is needed unless otherwise documented below in the visit note. 

## 2016-08-11 NOTE — Patient Instructions (Addendum)
Please complete lab work prior to leaving. Add caltrate 600mg  twice daily for bone healthy. Continue healthy diet and regular exercise.

## 2016-08-11 NOTE — Progress Notes (Signed)
Subjective:    Patient ID: Julia Carr, female    DOB: September 20, 1955, 60 y.o.   MRN: [id number]  HPI  Patient presents today for complete physical.  Immunizations:  Tetanus up to date,  Declines flu shot Diet: healthy Exercise: runs regularly Colonoscopy: 2014 normal per patient- recommended 5 year.  Dexa: 2013 Pap Smear: 2016 normal Mammogram: due    Review of Systems  Constitutional: Negative for unexpected weight change.  HENT: Negative for hearing loss and rhinorrhea.   Eyes: Negative for visual disturbance.  Respiratory: Negative for cough.   Cardiovascular: Negative for leg swelling.  Gastrointestinal: Negative for constipation and diarrhea.  Genitourinary: Negative for dysuria and frequency.  Musculoskeletal: Negative for arthralgias and myalgias.  Skin: Negative for rash.  Neurological: Negative for headaches.  Hematological: Negative for adenopathy.  Psychiatric/Behavioral:       Denies depression/anxiety       Past Medical History:  Diagnosis Date  . Atrial fibrillation (HCC)   . Hemorrhoids   . History of chicken pox   . HTN (hypertension)   . Pneumothorax      Social History   Social History  . Marital status: Single    Spouse name: N/A  . Number of children: N/A  . Years of education: N/A   Occupational History  . Not on file.   Social History Main Topics  . Smoking status: Former Smoker    Packs/day: 0.50  . Smokeless tobacco: Never Used  . Alcohol use Yes     Comment: occasional  . Drug use: No  . Sexual activity: Not on file   Other Topics Concern  . 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    One dog    Past Surgical History:  Procedure Laterality Date  . CHEST TUBE INSERTION      Family History  Problem Relation Age of Onset  . Cancer Mother     ovarian  . Diabetes Mother     type II  . Alcohol abuse  Father   . Hypertension Father   . Heart murmur Sister   . Cancer Paternal Grandmother     breast  . Heart disease Paternal Grandmother   . Alcohol abuse Paternal Grandfather   . Cancer Paternal Aunt     breast    Allergies  Allergen Reactions  . Latex Hives    Current Outpatient Prescriptions on File Prior to Visit  Medication Sig Dispense Refill  . Flaxseed, Linseed, (FLAXSEED OIL PO) Take by mouth.      . Multiple Vitamin (MULTIVITAMIN PO) Take by mouth.       No current facility-administered medications on file prior to visit.     BP 140/76 (BP Location: Left Arm, Cuff Size: Normal)   Pulse 70   Temp 97.8 F (36.6 C) (Oral)   Resp 16   Ht 5\' 8"  (1.727 m)   Wt 148 lb 6.4 oz (67.3 kg)   SpO2 100% Comment: room air  BMI 22.56 kg/m    Objective:   Physical Exam  Physical Exam  Constitutional: She is oriented to person, place, and time. She appears well-developed and well-nourished. No distress.  HENT:  Head: Normocephalic and atraumatic.  Right Ear: Tympanic membrane and ear canal normal.  Left Ear: Tympanic membrane and ear canal normal.  Mouth/Throat: Oropharynx is clear and moist.  Eyes:  Pupils are equal, round, and reactive to light. No scleral icterus.  Neck: Normal range of motion. No thyromegaly present.  Cardiovascular: Normal rate and regular rhythm.   No murmur heard. Pulmonary/Chest: Effort normal and breath sounds normal. No respiratory distress. He has no wheezes. She has no rales. She exhibits no tenderness.  Abdominal: Soft. Bowel sounds are normal. She exhibits no distension and no mass. There is no tenderness. There is no rebound and no guarding.  Musculoskeletal: She exhibits no edema.  Lymphadenopathy:    She has no cervical adenopathy.  Neurological: She is alert and oriented to person, place, and time. She exhibits normal muscle tone. Coordination normal.  Skin: Skin is warm and dry.  Psychiatric: She has a normal mood and affect. Her  behavior is normal. Judgment and thought content normal.  Breasts: Examined lying Right: Without masses, retractions, discharge or axillary adenopathy.  Left: Without masses, retractions, discharge or axillary adenopathy.  Pelvic: deferred          Assessment & Plan:         Assessment & Plan:  Preventative Care- discussed healthy diet, exercise, add caltrate 600mg  bid.  EKG tracing is personally reviewed.  EKG notes NSR.  No acute changes.

## 2016-08-12 ENCOUNTER — Encounter: Payer: Self-pay | Admitting: Family

## 2016-08-12 LAB — CBC WITH DIFFERENTIAL/PLATELET
BASOS PCT: 0.4 % (ref 0.0–3.0)
Basophils Absolute: 0 10*3/uL (ref 0.0–0.1)
EOS ABS: 0.1 10*3/uL (ref 0.0–0.7)
EOS PCT: 1.6 % (ref 0.0–5.0)
HEMATOCRIT: 39 % (ref 36.0–46.0)
HEMOGLOBIN: 13.3 g/dL (ref 12.0–15.0)
LYMPHS PCT: 40.4 % (ref 12.0–46.0)
Lymphs Abs: 2.5 10*3/uL (ref 0.7–4.0)
MCHC: 34 g/dL (ref 30.0–36.0)
MCV: 86.8 fl (ref 78.0–100.0)
Monocytes Absolute: 0.5 10*3/uL (ref 0.1–1.0)
Monocytes Relative: 8.5 % (ref 3.0–12.0)
Neutro Abs: 3.1 10*3/uL (ref 1.4–7.7)
Neutrophils Relative %: 49.1 % (ref 43.0–77.0)
Platelets: 258 10*3/uL (ref 150.0–400.0)
RBC: 4.49 Mil/uL (ref 3.87–5.11)
RDW: 12.8 % (ref 11.5–15.5)
WBC: 6.2 10*3/uL (ref 4.0–10.5)

## 2016-08-12 LAB — HEPATIC FUNCTION PANEL
ALT: 16 U/L (ref 0–35)
AST: 20 U/L (ref 0–37)
Albumin: 4.4 g/dL (ref 3.5–5.2)
Alkaline Phosphatase: 77 U/L (ref 39–117)
BILIRUBIN DIRECT: 0.1 mg/dL (ref 0.0–0.3)
TOTAL PROTEIN: 7.7 g/dL (ref 6.0–8.3)
Total Bilirubin: 0.5 mg/dL (ref 0.2–1.2)

## 2016-08-12 LAB — URINALYSIS, ROUTINE W REFLEX MICROSCOPIC
BILIRUBIN URINE: NEGATIVE
HGB URINE DIPSTICK: NEGATIVE
KETONES UR: NEGATIVE
Leukocytes, UA: NEGATIVE
Nitrite: NEGATIVE
Specific Gravity, Urine: <=1.005 — AB (ref 1.000–1.030)
TOTAL PROTEIN, URINE-UPE24: NEGATIVE
URINE GLUCOSE: NEGATIVE
UROBILINOGEN UA: 0.2 (ref 0.0–1.0)
WBC UA: NONE SEEN — AB (ref 0–?)
pH: 6 (ref 5.0–8.0)

## 2016-08-12 LAB — LIPID PANEL
CHOL/HDL RATIO: 4
Cholesterol: 184 mg/dL (ref 0–200)
HDL: 48.8 mg/dL (ref >39.00)
LDL Cholesterol: 111 mg/dL — ABNORMAL HIGH (ref 0–99)
NONHDL: 134.97
TRIGLYCERIDES: 122 mg/dL (ref 0.0–149.0)
VLDL: 24.4 mg/dL (ref 0.0–40.0)

## 2016-08-12 LAB — BASIC METABOLIC PANEL
BUN: 14 mg/dL (ref 6–23)
CHLORIDE: 103 meq/L (ref 96–112)
CO2: 27 mEq/L (ref 19–32)
CREATININE: 0.71 mg/dL (ref 0.40–1.20)
Calcium: 9.4 mg/dL (ref 8.4–10.5)
GFR: 89.71 mL/min (ref >60.00)
Glucose, Bld: 85 mg/dL (ref 70–99)
POTASSIUM: 3.7 meq/L (ref 3.5–5.1)
SODIUM: 139 meq/L (ref 135–145)

## 2016-08-12 LAB — TSH: TSH: 2.18 u[IU]/mL (ref 0.35–4.50)

## 2016-08-19 ENCOUNTER — Ambulatory Visit (HOSPITAL_BASED_OUTPATIENT_CLINIC_OR_DEPARTMENT_OTHER)
Admission: RE | Admit: 2016-08-19 | Discharge: 2016-08-19 | Disposition: A | Discharge status: Home / Self Care | Payer: BLUE CROSS/BLUE SHIELD | Source: Ambulatory Visit | Attending: Family | Admitting: Family

## 2016-08-19 ENCOUNTER — Encounter: Payer: Self-pay | Admitting: Family

## 2016-08-19 DIAGNOSIS — Z780 Asymptomatic menopausal state: Secondary | ICD-10-CM | POA: Insufficient documentation

## 2016-08-19 DIAGNOSIS — Z1231 Encounter for screening mammogram for malignant neoplasm of breast: Secondary | ICD-10-CM | POA: Insufficient documentation

## 2016-08-19 DIAGNOSIS — Z13820 Encounter for screening for osteoporosis: Secondary | ICD-10-CM | POA: Insufficient documentation

## 2016-08-19 DIAGNOSIS — E2839 Other primary ovarian failure: Secondary | ICD-10-CM | POA: Insufficient documentation

## 2016-08-19 DIAGNOSIS — Z1239 Encounter for other screening for malignant neoplasm of breast: Secondary | ICD-10-CM

## 2017-01-14 DIAGNOSIS — I480 Paroxysmal atrial fibrillation: Secondary | ICD-10-CM | POA: Diagnosis not present

## 2017-01-14 DIAGNOSIS — Z6822 Body mass index (BMI) 22.0-22.9, adult: Secondary | ICD-10-CM | POA: Diagnosis not present

## 2017-01-14 DIAGNOSIS — I10 Essential (primary) hypertension: Secondary | ICD-10-CM | POA: Diagnosis not present

## 2017-08-03 DIAGNOSIS — Z9889 Other specified postprocedural states: Secondary | ICD-10-CM

## 2017-08-03 HISTORY — DX: Other specified postprocedural states: Z98.890

## 2017-08-04 ENCOUNTER — Encounter: Payer: Self-pay | Admitting: Family

## 2017-08-04 ENCOUNTER — Ambulatory Visit (INDEPENDENT_AMBULATORY_CARE_PROVIDER_SITE_OTHER): Payer: BLUE CROSS/BLUE SHIELD | Admitting: Family

## 2017-08-04 VITALS — BP 122/81 | HR 64 | Temp 97.6°F | Resp 16 | Ht 68.0 in | Wt 148.4 lb

## 2017-08-04 DIAGNOSIS — Z0000 Encounter for general adult medical examination without abnormal findings: Secondary | ICD-10-CM

## 2017-08-04 DIAGNOSIS — I10 Essential (primary) hypertension: Secondary | ICD-10-CM | POA: Diagnosis not present

## 2017-08-04 DIAGNOSIS — R0989 Other specified symptoms and signs involving the circulatory and respiratory systems: Secondary | ICD-10-CM | POA: Diagnosis not present

## 2017-08-04 DIAGNOSIS — Z9889 Other specified postprocedural states: Secondary | ICD-10-CM | POA: Diagnosis not present

## 2017-08-04 DIAGNOSIS — I480 Paroxysmal atrial fibrillation: Secondary | ICD-10-CM | POA: Diagnosis not present

## 2017-08-04 LAB — CBC WITH DIFFERENTIAL/PLATELET
BASOS ABS: 0 10*3/uL (ref 0.0–0.1)
Basophils Relative: 0.6 % (ref 0.0–3.0)
EOS ABS: 0.1 10*3/uL (ref 0.0–0.7)
Eosinophils Relative: 1.5 % (ref 0.0–5.0)
HEMATOCRIT: 40.3 % (ref 36.0–46.0)
Hemoglobin: 13.3 g/dL (ref 12.0–15.0)
LYMPHS PCT: 33.4 % (ref 12.0–46.0)
Lymphs Abs: 1.9 10*3/uL (ref 0.7–4.0)
MCHC: 33.2 g/dL (ref 30.0–36.0)
MCV: 89.4 fl (ref 78.0–100.0)
MONO ABS: 0.5 10*3/uL (ref 0.1–1.0)
Monocytes Relative: 9 % (ref 3.0–12.0)
NEUTROS ABS: 3.2 10*3/uL (ref 1.4–7.7)
NEUTROS PCT: 55.5 % (ref 43.0–77.0)
Platelets: 317 10*3/uL (ref 150.0–400.0)
RBC: 4.5 Mil/uL (ref 3.87–5.11)
RDW: 12.8 % (ref 11.5–15.5)
WBC: 5.8 10*3/uL (ref 4.0–10.5)

## 2017-08-04 LAB — BASIC METABOLIC PANEL
BUN: 13 mg/dL (ref 6–23)
CALCIUM: 9.6 mg/dL (ref 8.4–10.5)
CO2: 31 meq/L (ref 19–32)
CREATININE: 0.76 mg/dL (ref 0.40–1.20)
Chloride: 104 mEq/L (ref 96–112)
GFR: 82.65 mL/min (ref >60.00)
Glucose, Bld: 92 mg/dL (ref 70–99)
Potassium: 5.2 mEq/L — ABNORMAL HIGH (ref 3.5–5.1)
Sodium: 140 mEq/L (ref 135–145)

## 2017-08-04 LAB — URINALYSIS, ROUTINE W REFLEX MICROSCOPIC
Bilirubin Urine: NEGATIVE
Hgb urine dipstick: NEGATIVE
KETONES UR: NEGATIVE
Leukocytes, UA: NEGATIVE
NITRITE: NEGATIVE
RBC / HPF: NONE SEEN (ref 0–?)
Specific Gravity, Urine: 1.015 (ref 1.000–1.030)
Total Protein, Urine: NEGATIVE
Urine Glucose: NEGATIVE
Urobilinogen, UA: 0.2 (ref 0.0–1.0)
WBC UA: NONE SEEN (ref 0–?)
pH: 8.5 — AB (ref 5.0–8.0)

## 2017-08-04 LAB — HEPATIC FUNCTION PANEL
ALT: 14 U/L (ref 0–35)
AST: 19 U/L (ref 0–37)
Albumin: 4.2 g/dL (ref 3.5–5.2)
Alkaline Phosphatase: 71 U/L (ref 39–117)
BILIRUBIN TOTAL: 0.7 mg/dL (ref 0.2–1.2)
Bilirubin, Direct: 0.1 mg/dL (ref 0.0–0.3)
Total Protein: 7.3 g/dL (ref 6.0–8.3)

## 2017-08-04 LAB — TSH: TSH: 2.84 u[IU]/mL (ref 0.35–4.50)

## 2017-08-04 LAB — LIPID PANEL
CHOLESTEROL: 179 mg/dL (ref 0–200)
HDL: 43.8 mg/dL (ref >39.00)
LDL Cholesterol: 111 mg/dL — ABNORMAL HIGH (ref 0–99)
NonHDL: 135.34
TRIGLYCERIDES: 121 mg/dL (ref 0.0–149.0)
Total CHOL/HDL Ratio: 4
VLDL: 24.2 mg/dL (ref 0.0–40.0)

## 2017-08-04 NOTE — Patient Instructions (Signed)
Keep up the great work with healthy diet and regular exercise.  Complete lab work prior to leaving.

## 2017-08-04 NOTE — Progress Notes (Signed)
Subjective:    Patient ID: Julia CoralNancy Carr, female    DOB: [birth year], 61 y.o.   MRN: [id number]  HPI  Patient presents today for complete physical.  Immunizations: tdap 2013, declines flu shot Diet: healthy Exercise:runs regularly Colonoscopy: 2014 Wt Readings from Last 3 Encounters:  08/04/17 148 lb 6.4 oz (67.3 kg)  08/11/16 148 lb 6.4 oz (67.3 kg)  07/18/15 146 lb 4.8 oz (66.4 kg)  Dexa: 12/17 Pap Smear: 07/18/15 Mammogram:  12/17, will complete later this month Vision: up to date Dental: up to date   Review of Systems  Constitutional: Negative for unexpected weight change.  HENT: Negative for hearing loss and rhinorrhea.   Eyes: Negative for visual disturbance.  Respiratory: Negative for cough.   Cardiovascular: Negative for leg swelling.  Gastrointestinal: Negative for blood in stool, constipation and diarrhea.  Genitourinary: Negative for dysuria, frequency and hematuria.  Musculoskeletal: Negative for arthralgias and myalgias.  Skin: Negative for rash.  Neurological: Negative for headaches.  Hematological: Negative for adenopathy.  Psychiatric/Behavioral:       Denies depression/anxiety     Past Medical History:  Diagnosis Date  . Atrial fibrillation (HCC)   . Hemorrhoids   . History of chicken pox   . HTN (hypertension)   . Pneumothorax      Social History   Socioeconomic History  . Marital status: Single    Spouse name: Not on file  . Number of children: Not on file  . Years of education: Not on file  . Highest education level: Not on file  Social Needs  . Financial resource strain: Not on file  . Food insecurity - worry: Not on file  . Food insecurity - inability: Not on file  . Transportation needs - medical: Not on file  . Transportation needs - non-medical: Not on file  Occupational History  . Not on file  Tobacco Use  . Smoking status: Former Smoker    Packs/day: 0.50  . Smokeless tobacco: Never Used  Substance and Sexual Activity  .  Alcohol use: Yes    Comment: occasional  . Drug use: No  . Sexual activity: Not on file  Other Topics Concern  . 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    One dog    Past Surgical History:  Procedure Laterality Date  . CHEST TUBE INSERTION      Family History  Problem Relation Age of Onset  . Cancer Mother        ovarian  . Diabetes Mother        type II  . Alcohol abuse Father   . Hypertension Father   . Heart murmur Sister   . Cancer Paternal Grandmother        breast  . Heart disease Paternal Grandmother   . Alcohol abuse Paternal Grandfather   . Cancer Paternal Aunt        breast    Allergies  Allergen Reactions  . Latex Hives    Current Outpatient Medications on File Prior to Visit  Medication Sig Dispense Refill  . Calcium Carbonate-Vitamin D (CALTRATE 600+D) 600-400 MG-UNIT tablet Take 1 tablet by mouth 2 (two) times daily.    . Flaxseed, Linseed, (FLAXSEED OIL PO) Take by mouth.      . Multiple Vitamin (MULTIVITAMIN PO) Take by mouth.       No current  facility-administered medications on file prior to visit.     BP 122/81 (BP Location: Left Arm, Cuff Size: Normal)   Pulse 64   Temp 97.6 F (36.4 C) (Oral)   Resp 16   Ht 5\' 8"  (1.727 m)   Wt 148 lb 6.4 oz (67.3 kg)   SpO2 100%   BMI 22.56 kg/m       Objective:   Physical Exam  Physical Exam  Constitutional: She is oriented to person, place, and time. She appears well-developed and well-nourished. No distress.  HENT:  Head: Normocephalic and atraumatic.  Right Ear: Tympanic membrane and ear canal normal.  Left Ear: Tympanic membrane and ear canal normal.  Mouth/Throat: Oropharynx is clear and moist.  Eyes: Pupils are equal, round, and reactive to light. No scleral icterus.  Neck: Normal range of motion. No thyromegaly present.  Cardiovascular: Normal rate and regular  rhythm.   No murmur heard. Pulmonary/Chest: Effort normal and breath sounds normal. No respiratory distress. He has no wheezes. She has no rales. She exhibits no tenderness.  Abdominal: Soft. Bowel sounds are normal. She exhibits no distension and no mass. There is no tenderness. There is no rebound and no guarding.  Musculoskeletal: She exhibits no edema.  Lymphadenopathy:    She has no cervical adenopathy.  Neurological: She is alert and oriented to person, place, and time. She has normal patellar reflexes. She exhibits normal muscle tone. Coordination normal.  Skin: Skin is warm and dry.  Psychiatric: She has a normal mood and affect. Her behavior is normal. Judgment and thought content normal.  Breasts: Examined lying Right: Without masses, retractions, discharge or axillary adenopathy.  Left: Without masses, retractions, discharge or axillary adenopathy.       Assessment & Plan:       Assessment & Plan:  Preventative care- encouraged pt to continue healthy diet, regular exercise.  Obtain routine lab work.

## 2017-08-12 ENCOUNTER — Other Ambulatory Visit: Payer: BLUE CROSS/BLUE SHIELD

## 2017-08-13 ENCOUNTER — Telehealth: Payer: Self-pay | Admitting: *Deleted

## 2017-08-13 NOTE — Telephone Encounter (Signed)
Labs mailed to pt with note to reschedule repeat lab appointment.  Copied from CRM [phone]. Topic: Inquiry >> Aug 13, 2017 11:05 AM Windy KalataMichael, Taylor L, NT wrote: Reason for CRM: patient is calling to request her blood work from her physical be sent to her via mail, thank you

## 2017-08-17 ENCOUNTER — Other Ambulatory Visit: Payer: BLUE CROSS/BLUE SHIELD

## 2017-08-20 ENCOUNTER — Other Ambulatory Visit (INDEPENDENT_AMBULATORY_CARE_PROVIDER_SITE_OTHER): Payer: BLUE CROSS/BLUE SHIELD

## 2017-08-20 ENCOUNTER — Other Ambulatory Visit: Payer: Self-pay | Admitting: Emergency Medicine

## 2017-08-20 DIAGNOSIS — E875 Hyperkalemia: Secondary | ICD-10-CM

## 2017-08-20 LAB — BASIC METABOLIC PANEL
BUN: 20 mg/dL (ref 6–23)
CHLORIDE: 104 meq/L (ref 96–112)
CO2: 29 meq/L (ref 19–32)
Calcium: 8.9 mg/dL (ref 8.4–10.5)
Creatinine, Ser: 0.78 mg/dL (ref 0.40–1.20)
GFR: 80.2 mL/min (ref >60.00)
Glucose, Bld: 79 mg/dL (ref 70–99)
Potassium: 4.2 mEq/L (ref 3.5–5.1)
SODIUM: 140 meq/L (ref 135–145)

## 2017-10-14 DIAGNOSIS — D485 Neoplasm of uncertain behavior of skin: Secondary | ICD-10-CM | POA: Diagnosis not present

## 2017-12-07 ENCOUNTER — Encounter: Payer: Self-pay | Admitting: Gastroenterology

## 2017-12-10 NOTE — Progress Notes (Signed)
Subjective:    Patient ID: Julia Carr, female    DOB: Jun 18, 1956, 62 y.o.   MRN: [id number]  HPI  Patient is a 62 year old female who presents today with chief complaint of right-sided ear pain. Reports that she had a cold that she "just got over." She reports that her right ear began hurting on Tuesday.  Denies sinus pressure, or cough. + nasal drainage. Denies associated fever.    Review of Systems See HPI  Past Medical History:  Diagnosis Date  . Atrial fibrillation (HCC)   . Hemorrhoids   . History of chicken pox   . HTN (hypertension)   . Pneumothorax      Social History   Socioeconomic History  . Marital status: Single    Spouse name: Not on file  . Number of children: Not on file  . Years of education: Not on file  . Highest education level: Not on file  Occupational History  . Not on file  Social Needs  . Financial resource strain: Not on file  . Food insecurity:    Worry: Not on file    Inability: Not on file  . Transportation needs:    Medical: Not on file    Non-medical: Not on file  Tobacco Use  . Smoking status: Former Smoker    Packs/day: 0.50  . Smokeless tobacco: Never Used  Substance and Sexual Activity  . Alcohol use: Yes    Comment: occasional  . Drug use: No  . Sexual activity: Not on file  Lifestyle  . Physical activity:    Days per week: Not on file    Minutes per session: Not on file  . Stress: Not on file  Relationships  . Social connections:    Talks on phone: Not on file    Gets together: Not on file    Attends religious service: Not on file    Active member of club or organization: Not on file    Attends meetings of clubs or organizations: Not on file    Relationship status: Not on file  . Intimate partner violence:    Fear of current or ex partner: Not on file    Emotionally abused: Not on file    Physically abused: Not on file    Forced sexual activity: Not on file  Other Topics Concern  . 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    One dog    Past Surgical History:  Procedure Laterality Date  . CHEST TUBE INSERTION      Family History  Problem Relation Age of Onset  . Cancer Mother        ovarian  . Diabetes Mother        type II  . Alcohol abuse Father   . Hypertension Father   . Heart murmur Sister   . Cancer Paternal Grandmother        breast  . Heart disease Paternal Grandmother   . Alcohol abuse Paternal Grandfather   . Cancer Paternal Aunt        breast    Allergies  Allergen Reactions  . Latex Hives    Current Outpatient Medications on File Prior to Visit  Medication Sig Dispense Refill  . Calcium Carbonate-Vitamin D (CALTRATE 600+D) 600-400 MG-UNIT tablet Take 1 tablet by mouth 2 (two) times daily.    Marland Kitchen  Flaxseed, Linseed, (FLAXSEED OIL PO) Take by mouth.      . Multiple Vitamin (MULTIVITAMIN PO) Take by mouth.       No current facility-administered medications on file prior to visit.     BP 111/83 (BP Location: Left Arm, Cuff Size: Normal)   Pulse (!) 57   Temp 98 F (36.7 C) (Oral)   Resp 16   Ht 5\' 8"  (1.727 m)   Wt 148 lb 9.6 oz (67.4 kg)   SpO2 99%   BMI 22.59 kg/m       Objective:   Physical Exam  Constitutional: She is oriented to person, place, and time. She appears well-developed and well-nourished.  HENT:  Head: Normocephalic and atraumatic.  Right Ear: Tympanic membrane and ear canal normal. Tympanic membrane is not erythematous.  Left Ear: Tympanic membrane and ear canal normal. Tympanic membrane is not erythematous.  Mouth/Throat: No oropharyngeal exudate, posterior oropharyngeal edema or posterior oropharyngeal erythema.  Cardiovascular: Normal rate, regular rhythm and normal heart sounds.  No murmur heard. Pulmonary/Chest: Effort normal and breath sounds normal. No respiratory distress. She has no wheezes.  Musculoskeletal:  She exhibits no edema.  Neurological: She is alert and oriented to person, place, and time.  Psychiatric: She has a normal mood and affect. Her behavior is normal. Judgment and thought content normal.          Assessment & Plan:  Eustachian Tube Dysfunction- advised pt as follows: Begin zyrtec 10mg  once daily and flonase 2 sprays each nostril once daily for eustachian tube dysfunction. Call if new/worsening symptoms or if symptoms are not improved in 1 week.

## 2017-12-11 ENCOUNTER — Ambulatory Visit: Payer: BLUE CROSS/BLUE SHIELD | Admitting: Family

## 2017-12-11 ENCOUNTER — Encounter: Payer: Self-pay | Admitting: Family

## 2017-12-11 VITALS — BP 111/83 | HR 57 | Temp 98.0°F | Resp 16 | Ht 68.0 in | Wt 148.6 lb

## 2017-12-11 DIAGNOSIS — H6981 Other specified disorders of Eustachian tube, right ear: Secondary | ICD-10-CM | POA: Diagnosis not present

## 2017-12-11 MED ORDER — FLUTICASONE PROPIONATE 50 MCG/ACT NA SUSP: 2.0000 | Freq: Every day | NASAL | 2 refills | Status: DC

## 2017-12-11 MED ORDER — CETIRIZINE HCL 10 MG PO TABS: 10.0000 mg | ORAL_TABLET | Freq: Every day | ORAL | 2 refills | Status: DC

## 2017-12-11 NOTE — Patient Instructions (Signed)
Please begin zyrtec 10mg  once daily and flonase 2 sprays each nostril once daily for eustachian tube dysfunction. Call if new/worsening symptoms or if symptoms are not improved in 1 week.

## 2018-01-26 ENCOUNTER — Encounter

## 2018-01-26 ENCOUNTER — Encounter: Payer: Self-pay | Admitting: Gastroenterology

## 2018-01-26 ENCOUNTER — Ambulatory Visit: Payer: BLUE CROSS/BLUE SHIELD | Admitting: Gastroenterology

## 2018-01-26 VITALS — BP 122/84 | HR 78 | Ht 68.0 in | Wt 148.4 lb

## 2018-01-26 DIAGNOSIS — R151 Fecal smearing: Secondary | ICD-10-CM

## 2018-01-26 DIAGNOSIS — K641 Second degree hemorrhoids: Secondary | ICD-10-CM | POA: Diagnosis not present

## 2018-01-26 DIAGNOSIS — K5900 Constipation, unspecified: Secondary | ICD-10-CM

## 2018-01-26 NOTE — Patient Instructions (Signed)

## 2018-01-26 NOTE — Progress Notes (Signed)
Julia Carr    [id number]    06-Aug-1956  Primary Care Physician:O'Sullivan, Efraim Kaufmann, NP  Referring Physician: Sandford Craze, NP [address] [place], [place] [address]  Chief complaint:  Hemorrhoids   HPI:  83 yr F with h/o afib/Aflutter s/p pulmonary ein isolation with cryo balloon and RFA of R atrial isthmus, chronic constipation here with complaints of symptomatic hemorrhoids. According to patient she had colonoscopy about 5 years ago in [facility], was normal, report is not available during this visit to review. Few years ago she had injection to external hemorrhoids which improved the external hemorrhoids but she is having issue with tissue protrusion, and seepage.  She does not feel clean when she wipes after a bowel movement, has to wipe multiple times.  Patient is requesting hemorrhoidal band ligation.  Her mother had ovarian cancer and sister had colon polyps removed Denies any nausea, vomiting, abdominal pain, melena or bright red blood per rectum    Outpatient Encounter Medications as of 01/26/2018  Medication Sig  . Calcium Carbonate-Vitamin D (CALTRATE 600+D) 600-400 MG-UNIT tablet Take 1 tablet by mouth 2 (two) times daily.  . Flaxseed, Linseed, (FLAXSEED OIL PO) Take by mouth.    . Multiple Vitamin (MULTIVITAMIN PO) Take by mouth.    . [DISCONTINUED] cetirizine (ZYRTEC) 10 MG tablet Take 1 tablet (10 mg total) by mouth daily.  . [DISCONTINUED] fluticasone (FLONASE) 50 MCG/ACT nasal spray Place 2 sprays into both nostrils daily.   No facility-administered encounter medications on file as of 01/26/2018.     Allergies as of 01/26/2018 - Review Complete 01/26/2018  Allergen Reaction Noted  . Latex Hives 01/11/2012    Past Medical History:  Diagnosis Date  . Atrial fibrillation (HCC)   . Hemorrhoids   . History of chicken pox   . HTN (hypertension)   . Pneumothorax     Past Surgical History:  Procedure Laterality Date  .  CHEST TUBE INSERTION      Family History  Problem Relation Age of Onset  . Cancer Mother        ovarian  . Diabetes Mother        type II  . Alcohol abuse Father   . Hypertension Father   . Heart murmur Sister   . Cancer Paternal Grandmother        breast  . Heart disease Paternal Grandmother   . Alcohol abuse Paternal Grandfather   . Cancer Paternal Aunt        breast    Social History   Socioeconomic History  . Marital status: Single    Spouse name: Not on file  . Number of children: Not on file  . Years of education: Not on file  . Highest education level: Not on file  Occupational History  . Not on file  Social Needs  . Financial resource strain: Not on file  . Food insecurity:    Worry: Not on file    Inability: Not on file  . Transportation needs:    Medical: Not on file    Non-medical: Not on file  Tobacco Use  . Smoking status: Former Smoker    Packs/day: 0.50  . Smokeless tobacco: Never Used  Substance and Sexual Activity  . Alcohol use: Yes    Comment: occasional  . Drug use: No  . Sexual activity: Not on file  Lifestyle  . Physical activity:    Days per week:  Not on file    Minutes per session: Not on file  . Stress: Not on file  Relationships  . Social connections:    Talks on phone: Not on file    Gets together: Not on file    Attends religious service: Not on file    Active member of club or organization: Not on file    Attends meetings of clubs or organizations: Not on file    Relationship status: Not on file  . Intimate partner violence:    Fear of current or ex partner: Not on file    Emotionally abused: Not on file    Physically abused: Not on file    Forced sexual activity: Not on file  Other Topics Concern  . 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    One dog      Review of systems: Review of Systems    Constitutional: Negative for fever and chills.  HENT: Positive for sinus trouble  Eyes: Negative for blurred vision.  Respiratory: Negative for cough, shortness of breath and wheezing.   Cardiovascular: Negative for chest pain and palpitations.  Gastrointestinal: as per HPI Genitourinary: Negative for dysuria, urgency, frequency and hematuria.  Musculoskeletal: Negative for myalgias, back pain and joint pain.  Skin: Negative for itching and rash.  Neurological: Negative for dizziness, tremors, focal weakness, seizures and loss of consciousness.  Endo/Heme/Allergies: Positive for seasonal allergies.  Psychiatric/Behavioral: Negative for depression, suicidal ideas and hallucinations.  All other systems reviewed and are negative.   Physical Exam: Vitals:   01/26/18 0830  BP: 122/84  Pulse: 78   Body mass index is 22.56 kg/m. Gen:      No acute distress HEENT:  EOMI, sclera anicteric Neck:     No masses; no thyromegaly Lungs:    Clear to auscultation bilaterally; normal respiratory effort CV:         Regular rate and rhythm; no murmurs Abd:      + bowel sounds; soft, non-tender; no palpable masses, no distension Ext:    No edema; adequate peripheral perfusion Skin:      Warm and dry; no rash Neuro: alert and oriented x 3 Psych: normal mood and affect  Data Reviewed:  Reviewed labs, radiology imaging, old records and pertinent past GI work up    Assessment and Plan/Recommendations:   The patient presents with symptomatic grade II  hemorrhoids, requesting rubber band ligation of his/her hemorrhoidal disease.  All risks, benefits and alternative forms of therapy were described and informed consent was obtained.  In the Left Lateral Decubitus position anoscopic examination revealed grade II hemorrhoids in the left lateral, right anterior and right posterior position(s).  The anorectum was pre-medicated with 0.125% nitroglycerin The decision was made to band the Left lateral   internal hemorrhoid, and the [facility] O'Regan System was used to perform band ligation without complication.  Digital anorectal examination was then performed to assure proper positioning of the band, and to adjust the banded tissue as required.  The patient was discharged home without pain or other issues.  Dietary and behavioral recommendations were given and along with follow-up instructions.     The following adjunctive treatments were recommended: Constipation: Continue to increase fluid intake and high-fiber diet  The patient will return in 2-4 weeks for  follow-up and possible additional banding as required. No complications were encountered  and the patient tolerated the procedure well.  Obtain records of prior colonoscopy to review and to assess when patient needs to come back to repeat colonoscopy for colorectal cancer screening   K. Scherry Ran , MD [phone]    CC: Sandford Craze, NP

## 2018-01-29 ENCOUNTER — Encounter: Payer: Self-pay | Admitting: Gastroenterology

## 2018-03-23 ENCOUNTER — Ambulatory Visit: Payer: BLUE CROSS/BLUE SHIELD | Admitting: Gastroenterology

## 2018-03-23 ENCOUNTER — Encounter: Payer: Self-pay | Admitting: Gastroenterology

## 2018-03-23 VITALS — BP 118/74 | HR 68 | Ht 68.0 in | Wt 148.4 lb

## 2018-03-23 DIAGNOSIS — K641 Second degree hemorrhoids: Secondary | ICD-10-CM | POA: Diagnosis not present

## 2018-03-23 NOTE — Patient Instructions (Signed)

## 2018-03-23 NOTE — Progress Notes (Signed)
PROCEDURE NOTE: The patient presents with symptomatic grade II  hemorrhoids, requesting rubber band ligation of his/her hemorrhoidal disease.  All risks, benefits and alternative forms of therapy were described and informed consent was obtained.   The anorectum was pre-medicated with 0.125% Nitroglycerine and Recticare The decision was made to band the Right anterior internal hemorrhoid, and the [facility] O'Regan System was used to perform band ligation without complication.  Digital anorectal examination was then performed to assure proper positioning of the band, and to adjust the banded tissue as required.  The patient was discharged home without pain or other issues.  Dietary and behavioral recommendations were given and along with follow-up instructions.     The patient will return in 2-4 weeks for  follow-up and possible additional banding as required. No complications were encountered and the patient tolerated the procedure well.  Iona BeardK. Veena Maloree Uplinger , MD [phone]

## 2018-03-24 ENCOUNTER — Encounter: Payer: Self-pay | Admitting: Gastroenterology

## 2018-04-28 ENCOUNTER — Encounter: Payer: Self-pay | Admitting: Gastroenterology

## 2018-04-28 ENCOUNTER — Ambulatory Visit: Payer: BLUE CROSS/BLUE SHIELD | Admitting: Gastroenterology

## 2018-04-28 VITALS — BP 130/70 | HR 62 | Ht 68.0 in | Wt 151.2 lb

## 2018-04-28 DIAGNOSIS — K641 Second degree hemorrhoids: Secondary | ICD-10-CM

## 2018-04-28 NOTE — Progress Notes (Signed)
PROCEDURE NOTE: The patient presents with symptomatic grade II  hemorrhoids, requesting rubber band ligation of his/her hemorrhoidal disease.  All risks, benefits and alternative forms of therapy were described and informed consent was obtained.   The anorectum was pre-medicated with 0.125% nitroglycerine and Recticare The decision was made to band right posterior the internal hemorrhoid, and the [facility] O'Regan System was used to perform band ligation without complication.  Digital anorectal examination was then performed to assure proper positioning of the band, and to adjust the banded tissue as required.  The patient was discharged home without pain or other issues.  Dietary and behavioral recommendations were given and along with follow-up instructions.    The patient will return  for  follow-up and possible additional banding as required. No complications were encountered and the patient tolerated the procedure well.  Iona BeardK. Veena Ernestyne Caldwell , MD [phone]

## 2018-04-28 NOTE — Patient Instructions (Signed)
HEMORRHOID BANDING PROCEDURE    FOLLOW-UP CARE   1. The procedure you have had should have been relatively painless since the banding of the area involved does not have nerve endings and there is no pain sensation.  The rubber band cuts off the blood supply to the hemorrhoid and the band may fall off as soon as 48 hours after the banding (the band may occasionally be seen in the toilet bowl following a bowel movement). You may notice a temporary feeling of fullness in the rectum which should respond adequately to plain Tylenol or Motrin.  2. Following the banding, avoid strenuous exercise that evening and resume full activity the next day.  A sitz bath (soaking in a warm tub) or bidet is soothing, and can be useful for cleansing the area after bowel movements.     3. To avoid constipation, take two tablespoons of natural wheat bran, natural oat bran, flax, Benefiber or any over the counter fiber supplement and increase your water intake to 7-8 glasses daily.    4. Unless you have been prescribed anorectal medication, do not put anything inside your rectum for two weeks: No suppositories, enemas, fingers, etc.  5. Occasionally, you may have more bleeding than usual after the banding procedure.  This is often from the untreated hemorrhoids rather than the treated one.  Don't be concerned if there is a tablespoon or so of blood.  If there is more blood than this, lie flat with your bottom higher than your head and apply an ice pack to the area. If the bleeding does not stop within a half an hour or if you feel faint, call our office at [phone] or go to the emergency room.  6. Problems are not common; however, if there is a substantial amount of bleeding, severe pain, chills, fever or difficulty passing urine (very rare) or other problems, you should call us at [phone] or report to the nearest emergency room.  7. Do not stay seated continuously for more than 2-3 hours for a day or two  after the procedure.  Tighten your buttock muscles 10-15 times every two hours and take 10-15 deep breaths every 1-2 hours.  Do not spend more than a few minutes on the toilet if you cannot empty your bowel; instead re-visit the toilet at a later time.    We will refer you to Dr Maisie Fushomas at [facility] for skin tag removal, they will contact you with that appointment

## 2018-05-10 ENCOUNTER — Telehealth: Payer: Self-pay | Admitting: *Deleted

## 2018-05-10 NOTE — Telephone Encounter (Signed)
Referral was done for patient to [facility], ppointment as been scheduled on 05/31/18 at 10:30am  Message was left for pt by [facility]

## 2018-05-31 ENCOUNTER — Ambulatory Visit: Payer: Self-pay | Admitting: General Surgery

## 2018-05-31 DIAGNOSIS — K644 Residual hemorrhoidal skin tags: Secondary | ICD-10-CM | POA: Diagnosis not present

## 2018-05-31 NOTE — H&P (Signed)
History of Present Illness Julia Levee MD; 05/31/2018 10:45 AM) The patient is a 62 year old female who presents with a complaint of anal problems. 62 year old female who presents to the office for evaluation of an anal skin tag. She has had some difficulty with hemorrhoids over the past couple years. A colonoscopy was performed which was normal approximately 5 years ago. She saw Dr. Lavon Paganini in the office and rubber band ligation was attempted on 2 separate occasions. This has gotten rid of most of her symptoms but she continues to have difficulty with wiping and anal hygiene due to an enlarged external skin tag. She was referred to me for further evaluation and possible excision.   Past Surgical History [name], LPN; 1/61/0960 45:40 AM) Oral Surgery  Diagnostic Studies History [name], LPN; 9/81/1914 78:29 AM) Colonoscopy 5-10 years ago Mammogram 1-3 years ago Pap Smear 1-5 years ago  Allergies [name], LPN; 5/62/1308 65:78 AM) No Known Drug Allergies [05/31/2018]:  Medication History [name], LPN; 4/69/6295 28:41 AM) Multivitamin (Oral) Active. Flaxseed Active. Omega 3 (1000MG  Capsule, Oral) Active. Medications Reconciled  Social History [name], LPN; 11/22/4008 27:25 AM) Alcohol use Occasional alcohol use. No caffeine use No drug use Tobacco use Former smoker.  Family History [name], LPN; 3/66/4403 47:42 AM) Family history unknown First Degree Relatives  Pregnancy / Birth History [name], LPN; 5/95/6387 56:43 AM) Age at menarche 17 years. Age of menopause 57-55 Gravida 1 Irregular periods Maternal age 33-30 Para 1  Other Problems [name], LPN; 11/28/5186 41:66 AM) Atrial Fibrillation Back Pain Gastroesophageal Reflux Disease Hemorrhoids High blood pressure     Review of Systems [name] LPN; 0/63/0160 10:93 AM) General Not Present- Appetite Loss, Chills, Fatigue,  Fever, Night Sweats, Weight Gain and Weight Loss. Skin Not Present- Change in Wart/Mole, Dryness, Hives, Jaundice, New Lesions, Non-Healing Wounds, Rash and Ulcer. HEENT Present- Wears glasses/contact lenses. Not Present- Earache, Hearing Loss, Hoarseness, Nose Bleed, Oral Ulcers, Ringing in the Ears, Seasonal Allergies, Sinus Pain, Sore Throat, Visual Disturbances and Yellow Eyes. Respiratory Not Present- Bloody sputum, Chronic Cough, Difficulty Breathing, Snoring and Wheezing. Breast Not Present- Breast Mass, Breast Pain, Nipple Discharge and Skin Changes. Cardiovascular Not Present- Chest Pain, Difficulty Breathing Lying Down, Leg Cramps, Palpitations, Rapid Heart Rate, Shortness of Breath and Swelling of Extremities. Gastrointestinal Not Present- Abdominal Pain, Bloating, Bloody Stool, Change in Bowel Habits, Chronic diarrhea, Constipation, Difficulty Swallowing, Excessive gas, Gets full quickly at meals, Hemorrhoids, Indigestion, Nausea, Rectal Pain and Vomiting. Female Genitourinary Not Present- Frequency, Nocturia, Painful Urination, Pelvic Pain and Urgency. Musculoskeletal Not Present- Back Pain, Joint Pain, Joint Stiffness, Muscle Pain, Muscle Weakness and Swelling of Extremities. Neurological Not Present- Decreased Memory, Fainting, Headaches, Numbness, Seizures, Tingling, Tremor, Trouble walking and Weakness. Psychiatric Not Present- Anxiety, Bipolar, Change in Sleep Pattern, Depression, Fearful and Frequent crying. Endocrine Not Present- Cold Intolerance, Excessive Hunger, Hair Changes, Heat Intolerance, Hot flashes and New Diabetes. Hematology Not Present- Blood Thinners, Easy Bruising, Excessive bleeding, Gland problems, HIV and Persistent Infections.  Vitals [name] LPN; 2/35/5732 20:25 AM) 05/31/2018 10:29 AM Weight: 148.4 lb Height: 68in Body Surface Area: 1.8 m Body Mass Index: 22.56 kg/m  Temp.: 97.52F(Temporal)  Pulse: 88 (Regular)  BP: 116/72 (Sitting,  Left Arm, Standard)      Physical Exam Julia Levee MD; 05/31/2018 10:46 AM)  General Mental Status-Alert. General Appearance-Not in acute distress. Build & Nutrition-Well nourished. Posture-Normal posture. Gait-Normal.  Head and Neck Head-normocephalic, atraumatic with no lesions or palpable masses.  Trachea-midline.  Chest and Lung Exam Chest and lung exam reveals -on auscultation, normal breath sounds, no adventitious sounds and normal vocal resonance.  Cardiovascular Cardiovascular examination reveals -normal heart sounds, regular rate and rhythm with no murmurs and no digital clubbing, cyanosis, edema, increased warmth or tenderness.  Abdomen Inspection Inspection of the abdomen reveals - No Hernias. Palpation/Percussion Palpation and Percussion of the abdomen reveal - Soft, Non Tender, No Rigidity (guarding), No hepatosplenomegaly and No Palpable abdominal masses.  Rectal Anorectal Exam External - skin tag(Posterior midline skin tag with chronic inflammation).  Neurologic Neurologic evaluation reveals -alert and oriented x 3 with no impairment of recent or remote memory, normal attention span and ability to concentrate, normal sensation and normal coordination.  Musculoskeletal Normal Exam - Bilateral-Upper Extremity Strength Normal and Lower Extremity Strength Normal.    Assessment & Plan Julia Levee MD; 05/31/2018 10:44 AM)  ANAL SKIN TAG (K64.4) Impression: 62 year old female who presents to the office with an enlarging anal skin tag. This is been present for many years but is beginning to cause her difficulty with hygiene. On exam today she has an elongated posterior midline skin tag which appears chronically inflamed. We have discussed excision including postoperative pain and restrictions. She has decided to proceed with this.

## 2018-07-14 ENCOUNTER — Encounter: Payer: Self-pay | Admitting: Family

## 2018-07-14 DIAGNOSIS — Z1231 Encounter for screening mammogram for malignant neoplasm of breast: Secondary | ICD-10-CM | POA: Diagnosis not present

## 2018-07-16 ENCOUNTER — Telehealth: Payer: Self-pay | Admitting: *Deleted

## 2018-07-16 NOTE — Telephone Encounter (Signed)
[facility]; forwarded to provider/SLS 11/15

## 2018-07-23 ENCOUNTER — Other Ambulatory Visit (HOSPITAL_COMMUNITY)
Admission: RE | Admit: 2018-07-23 | Discharge: 2018-07-23 | Disposition: A | Discharge status: Home / Self Care | Payer: BLUE CROSS/BLUE SHIELD | Source: Ambulatory Visit | Attending: Family | Admitting: Family

## 2018-07-23 ENCOUNTER — Ambulatory Visit (INDEPENDENT_AMBULATORY_CARE_PROVIDER_SITE_OTHER): Payer: BLUE CROSS/BLUE SHIELD | Admitting: Family

## 2018-07-23 ENCOUNTER — Encounter: Payer: Self-pay | Admitting: Family

## 2018-07-23 VITALS — BP 118/85 | HR 56 | Temp 97.8°F | Ht 68.0 in | Wt 148.8 lb

## 2018-07-23 DIAGNOSIS — Z01419 Encounter for gynecological examination (general) (routine) without abnormal findings: Secondary | ICD-10-CM

## 2018-07-23 DIAGNOSIS — Z0000 Encounter for general adult medical examination without abnormal findings: Secondary | ICD-10-CM | POA: Diagnosis not present

## 2018-07-23 LAB — BASIC METABOLIC PANEL
BUN: 19 mg/dL (ref 6–23)
CHLORIDE: 102 meq/L (ref 96–112)
CO2: 30 meq/L (ref 19–32)
CREATININE: 0.72 mg/dL (ref 0.40–1.20)
Calcium: 9 mg/dL (ref 8.4–10.5)
GFR: 87.69 mL/min (ref >60.00)
Glucose, Bld: 81 mg/dL (ref 70–99)
POTASSIUM: 3.8 meq/L (ref 3.5–5.1)
SODIUM: 139 meq/L (ref 135–145)

## 2018-07-23 LAB — CBC WITH DIFFERENTIAL/PLATELET
BASOS PCT: 0.6 % (ref 0.0–3.0)
Basophils Absolute: 0 10*3/uL (ref 0.0–0.1)
EOS PCT: 1.4 % (ref 0.0–5.0)
Eosinophils Absolute: 0.1 10*3/uL (ref 0.0–0.7)
HEMATOCRIT: 38.3 % (ref 36.0–46.0)
Hemoglobin: 12.8 g/dL (ref 12.0–15.0)
LYMPHS PCT: 38.1 % (ref 12.0–46.0)
Lymphs Abs: 2.2 10*3/uL (ref 0.7–4.0)
MCHC: 33.3 g/dL (ref 30.0–36.0)
MCV: 87.9 fl (ref 78.0–100.0)
MONOS PCT: 9.7 % (ref 3.0–12.0)
Monocytes Absolute: 0.6 10*3/uL (ref 0.1–1.0)
NEUTROS ABS: 2.9 10*3/uL (ref 1.4–7.7)
Neutrophils Relative %: 50.2 % (ref 43.0–77.0)
PLATELETS: 291 10*3/uL (ref 150.0–400.0)
RBC: 4.36 Mil/uL (ref 3.87–5.11)
RDW: 13.3 % (ref 11.5–15.5)
WBC: 5.8 10*3/uL (ref 4.0–10.5)

## 2018-07-23 LAB — URINALYSIS, ROUTINE W REFLEX MICROSCOPIC
Bilirubin Urine: NEGATIVE
Hgb urine dipstick: NEGATIVE
Ketones, ur: NEGATIVE
Leukocytes, UA: NEGATIVE
Nitrite: NEGATIVE
PH: 7 (ref 5.0–8.0)
RBC / HPF: NONE SEEN (ref 0–?)
Specific Gravity, Urine: <=1.005 — AB (ref 1.000–1.030)
TOTAL PROTEIN, URINE-UPE24: NEGATIVE
UROBILINOGEN UA: 0.2 (ref 0.0–1.0)
Urine Glucose: NEGATIVE
WBC UA: NONE SEEN (ref 0–?)

## 2018-07-23 LAB — LIPID PANEL
Cholesterol: 163 mg/dL (ref 0–200)
HDL: 45.5 mg/dL (ref >39.00)
LDL Cholesterol: 101 mg/dL — ABNORMAL HIGH (ref 0–99)
NonHDL: 117.97
TRIGLYCERIDES: 87 mg/dL (ref 0.0–149.0)
Total CHOL/HDL Ratio: 4
VLDL: 17.4 mg/dL (ref 0.0–40.0)

## 2018-07-23 LAB — HEPATIC FUNCTION PANEL
ALBUMIN: 4.2 g/dL (ref 3.5–5.2)
ALT: 17 U/L (ref 0–35)
AST: 21 U/L (ref 0–37)
Alkaline Phosphatase: 100 U/L (ref 39–117)
Bilirubin, Direct: 0.1 mg/dL (ref 0.0–0.3)
TOTAL PROTEIN: 7.2 g/dL (ref 6.0–8.3)
Total Bilirubin: 0.6 mg/dL (ref 0.2–1.2)

## 2018-07-23 LAB — TSH: TSH: 2.31 u[IU]/mL (ref 0.35–4.50)

## 2018-07-23 NOTE — Addendum Note (Signed)
Addended by: Verdie ShireBAYNES, Elwood Bazinet M on: 07/23/2018 02:30 PM   Modules accepted: Orders

## 2018-07-23 NOTE — Progress Notes (Signed)
Subjective:    Patient ID: Julia Carr, female    DOB: 1956-04-04, 62 y.o.   MRN: [id number]  HPI  Patient presents today for complete physical.  Immunizations: tdap up to date, declines flu shot Diet: healthy Exercise: enjoys running Colonoscopy: 09/01/12 normal per pt Dexa: 08/19/16 Pap Smear: 11/16 Mammogram: 11/13- normal  Vision: 6/19 Dental: up to date     Review of Systems  Constitutional: Negative for unexpected weight change.  HENT: Negative for rhinorrhea.   Respiratory: Negative for cough.   Cardiovascular: Negative for leg swelling.  Gastrointestinal: Negative for blood in stool, constipation and diarrhea.  Genitourinary: Negative for dysuria, frequency and hematuria.  Musculoskeletal: Negative for arthralgias and myalgias.  Skin: Negative for rash.  Neurological: Negative for headaches.  Hematological: Negative for adenopathy.  Psychiatric/Behavioral:       Denies depression/anxiety   Past Medical History:  Diagnosis Date  . Atrial fibrillation (HCC)   . Hemorrhoids   . History of chicken pox   . HTN (hypertension)   . Pneumothorax      Social History   Socioeconomic History  . Marital status: Single    Spouse name: Not on file  . Number of children: Not on file  . Years of education: Not on file  . Highest education level: Not on file  Occupational History  . Not on file  Social Needs  . Financial resource strain: Not on file  . Food insecurity:    Worry: Not on file    Inability: Not on file  . Transportation needs:    Medical: Not on file    Non-medical: Not on file  Tobacco Use  . Smoking status: Former Smoker    Packs/day: 0.50  . Smokeless tobacco: Never Used  . Tobacco comment: quit 10 years ago  Substance and Sexual Activity  . Alcohol use: Yes    Comment: occasional  . Drug use: No  . Sexual activity: Not on file  Lifestyle  . Physical activity:    Days per week: Not on file    Minutes per session: Not on file  .  Stress: Not on file  Relationships  . Social connections:    Talks on phone: Not on file    Gets together: Not on file    Attends religious service: Not on file    Active member of club or organization: Not on file    Attends meetings of clubs or organizations: Not on file    Relationship status: Not on file  . Intimate partner violence:    Fear of current or ex partner: Not on file    Emotionally abused: Not on file    Physically abused: Not on file    Forced sexual activity: Not on file  Other Topics Concern  . 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    One dog    Past Surgical History:  Procedure Laterality Date  . CHEST TUBE INSERTION      Family History  Problem Relation Age of Onset  . Cancer Mother        ovarian  . Diabetes Mother        type II  . Alcohol abuse Father   . Hypertension Father   . Hypertension Sister   . Cancer Paternal Grandmother        breast  . Heart  disease Paternal Grandmother   . Alcohol abuse Paternal Grandfather   . Cancer Paternal Aunt        breast  . Heart murmur Sister   . Osteoarthritis Sister     Allergies  Allergen Reactions  . Latex Hives    Current Outpatient Medications on File Prior to Visit  Medication Sig Dispense Refill  . Calcium Carbonate-Vitamin D (CALTRATE 600+D) 600-400 MG-UNIT tablet Take 1 tablet by mouth 2 (two) times daily.    . Multiple Vitamin (MULTIVITAMIN PO) Take by mouth.      . Omega-3 1000 MG CAPS Take 1 capsule by mouth daily.    . WHEAT GERM OIL PO Take by mouth daily.    . Flaxseed, Linseed, (FLAXSEED OIL PO) Take by mouth.       No current facility-administered medications on file prior to visit.     BP 118/85 (BP Location: Right Arm, Patient Position: Sitting, Cuff Size: Normal)   Pulse (!) 56   Temp 97.8 F (36.6 C) (Oral)   Ht 5\' 8"  (1.727 m)   Wt 148 lb 12.8 oz  (67.5 kg)   SpO2 100%   BMI 22.62 kg/m       Objective:   Physical Exam  Physical Exam  Constitutional: She is oriented to person, place, and time. She appears well-developed and well-nourished. No distress.  HENT:  Head: Normocephalic and atraumatic.  Right Ear: Tympanic membrane and ear canal normal.  Left Ear: Tympanic membrane and ear canal normal.  Mouth/Throat: Oropharynx is clear and moist.  Eyes: Pupils are equal, round, and reactive to light. No scleral icterus.  Neck: Normal range of motion. No thyromegaly present.  Cardiovascular: Normal rate and regular rhythm.   No murmur heard. Pulmonary/Chest: Effort normal and breath sounds normal. No respiratory distress. He has no wheezes. She has no rales. She exhibits no tenderness.  Abdominal: Soft. Bowel sounds are normal. She exhibits no distension and no mass. There is no tenderness. There is no rebound and no guarding.  Musculoskeletal: She exhibits no edema.  Lymphadenopathy:    She has no cervical adenopathy.  Neurological: She is alert and oriented to person, place, and time. She has normal patellar reflexes. She exhibits normal muscle tone. Coordination normal.  Skin: Skin is warm and dry.  Psychiatric: She has a normal mood and affect. Her behavior is normal. Judgment and thought content normal.  Breasts: Examined lying Right: Without masses, retractions, discharge or axillary adenopathy.  Left: Without masses, retractions, discharge or axillary adenopathy.  Inguinal/mons: Normal without inguinal adenopathy  External genitalia: Normal  BUS/Urethra/Skene's glands: Normal  Bladder: Normal  Vagina: Normal, hypertrophic Cervix: Normal  Uterus: normal in size, shape and contour. Midline and mobile  Adnexa/parametria:  Rt: Without masses or tenderness.  Lt: Without masses or tenderness.  Anus and perineum: Normal            Assessment & Plan:   Preventative care- encouraged pt to continue with healthy diet,  exercise. Obtain routine lab work. Pap performed today. Mammo/colo/dexa up to date. Declines flu shot, tetanus up to date.      Assessment & Plan:  EKG tracing is personally reviewed.  EKG notes NSR.  No acute changes.

## 2018-07-23 NOTE — Patient Instructions (Addendum)
Please complete lab work prior to leaving. Continue healthy diet ane regular exercise.  

## 2018-07-27 LAB — CYTOLOGY - PAP
DIAGNOSIS: NEGATIVE
HPV: NOT DETECTED

## 2018-08-06 ENCOUNTER — Encounter (HOSPITAL_BASED_OUTPATIENT_CLINIC_OR_DEPARTMENT_OTHER): Payer: Self-pay | Admitting: *Deleted

## 2018-08-06 ENCOUNTER — Other Ambulatory Visit: Payer: Self-pay

## 2018-08-06 NOTE — Progress Notes (Signed)
Spoke w/ pt via phone for pre-op interview.  Npo after mn. Arrive at 0530.  Current lab results and ekg dated 07-23-2018 in chart and epic.

## 2018-08-12 ENCOUNTER — Ambulatory Visit (HOSPITAL_BASED_OUTPATIENT_CLINIC_OR_DEPARTMENT_OTHER): Payer: BLUE CROSS/BLUE SHIELD | Admitting: Anesthesiology

## 2018-08-12 ENCOUNTER — Ambulatory Visit (HOSPITAL_BASED_OUTPATIENT_CLINIC_OR_DEPARTMENT_OTHER)
Admission: RE | Admit: 2018-08-12 | Discharge: 2018-08-12 | Disposition: A | Discharge status: Home / Self Care | Payer: BLUE CROSS/BLUE SHIELD | Source: Ambulatory Visit | Attending: General Surgery | Admitting: General Surgery

## 2018-08-12 ENCOUNTER — Encounter (HOSPITAL_BASED_OUTPATIENT_CLINIC_OR_DEPARTMENT_OTHER): Payer: Self-pay

## 2018-08-12 ENCOUNTER — Encounter (HOSPITAL_BASED_OUTPATIENT_CLINIC_OR_DEPARTMENT_OTHER)
Admission: RE | Disposition: A | Discharge status: Home / Self Care | Payer: Self-pay | Source: Ambulatory Visit | Attending: General Surgery

## 2018-08-12 DIAGNOSIS — Z79899 Other long term (current) drug therapy: Secondary | ICD-10-CM | POA: Insufficient documentation

## 2018-08-12 DIAGNOSIS — I4891 Unspecified atrial fibrillation: Secondary | ICD-10-CM | POA: Diagnosis not present

## 2018-08-12 DIAGNOSIS — I10 Essential (primary) hypertension: Secondary | ICD-10-CM | POA: Diagnosis not present

## 2018-08-12 DIAGNOSIS — Z87891 Personal history of nicotine dependence: Secondary | ICD-10-CM | POA: Insufficient documentation

## 2018-08-12 DIAGNOSIS — K644 Residual hemorrhoidal skin tags: Secondary | ICD-10-CM | POA: Insufficient documentation

## 2018-08-12 HISTORY — PX: MASS EXCISION: SHX2000

## 2018-08-12 HISTORY — DX: Paroxysmal atrial fibrillation: I48.0

## 2018-08-12 HISTORY — DX: Residual hemorrhoidal skin tags: K64.4

## 2018-08-12 HISTORY — DX: Personal history of other diseases of the respiratory system: Z87.09

## 2018-08-12 HISTORY — DX: Presence of spectacles and contact lenses: Z97.3

## 2018-08-12 HISTORY — DX: Other specified symptoms and signs involving the circulatory and respiratory systems: R09.89

## 2018-08-12 SURGERY — EXCISION MASS
Anesthesia: Monitor Anesthesia Care | Site: Perineum

## 2018-08-12 MED ORDER — MIDAZOLAM HCL 5 MG/5ML IJ SOLN
INTRAMUSCULAR | Status: DC | PRN
  Administered 2018-08-12: 2 mg via INTRAVENOUS

## 2018-08-12 MED ORDER — FENTANYL CITRATE (PF) 100 MCG/2ML IJ SOLN
INTRAMUSCULAR | Status: AC
  Filled 2018-08-12: qty 2

## 2018-08-12 MED ORDER — TRAMADOL HCL 50 MG PO TABS: 50.0000 mg | ORAL_TABLET | Freq: Four times a day (QID) | ORAL | 0 refills | Status: DC | PRN

## 2018-08-12 MED ORDER — PROPOFOL 500 MG/50ML IV EMUL
INTRAVENOUS | Status: DC | PRN
  Administered 2018-08-12: 25 ug/kg/min via INTRAVENOUS

## 2018-08-12 MED ORDER — OXYCODONE HCL 5 MG PO TABS
5.0000 mg | ORAL_TABLET | ORAL | Status: DC | PRN
  Filled 2018-08-12: qty 2

## 2018-08-12 MED ORDER — MIDAZOLAM HCL 2 MG/2ML IJ SOLN
INTRAMUSCULAR | Status: AC
  Filled 2018-08-12: qty 2

## 2018-08-12 MED ORDER — SODIUM CHLORIDE 0.9% FLUSH
3.0000 mL | Freq: Two times a day (BID) | INTRAVENOUS | Status: DC
  Filled 2018-08-12: qty 3

## 2018-08-12 MED ORDER — DEXAMETHASONE SODIUM PHOSPHATE 4 MG/ML IJ SOLN
INTRAMUSCULAR | Status: DC | PRN
  Administered 2018-08-12: 5 mg via INTRAVENOUS

## 2018-08-12 MED ORDER — ACETAMINOPHEN 650 MG RE SUPP
650.0000 mg | RECTAL | Status: DC | PRN
  Filled 2018-08-12: qty 1

## 2018-08-12 MED ORDER — ACETAMINOPHEN 500 MG PO TABS
ORAL_TABLET | ORAL | Status: AC
  Filled 2018-08-12: qty 2

## 2018-08-12 MED ORDER — FENTANYL CITRATE (PF) 100 MCG/2ML IJ SOLN
INTRAMUSCULAR | Status: DC | PRN
  Administered 2018-08-12 (×4): 25 ug via INTRAVENOUS

## 2018-08-12 MED ORDER — ACETAMINOPHEN 325 MG PO TABS
650.0000 mg | ORAL_TABLET | ORAL | Status: DC | PRN
  Filled 2018-08-12: qty 2

## 2018-08-12 MED ORDER — LIDOCAINE 2% (20 MG/ML) 5 ML SYRINGE
INTRAMUSCULAR | Status: AC
  Filled 2018-08-12: qty 5

## 2018-08-12 MED ORDER — BUPIVACAINE LIPOSOME 1.3 % IJ SUSP
20.0000 mL | Freq: Once | INTRAMUSCULAR | Status: DC
  Filled 2018-08-12: qty 20

## 2018-08-12 MED ORDER — SODIUM CHLORIDE 0.9% FLUSH
3.0000 mL | INTRAVENOUS | Status: DC | PRN
  Filled 2018-08-12: qty 3

## 2018-08-12 MED ORDER — ONDANSETRON HCL 4 MG/2ML IJ SOLN
INTRAMUSCULAR | Status: AC
  Filled 2018-08-12: qty 2

## 2018-08-12 MED ORDER — ONDANSETRON HCL 4 MG/2ML IJ SOLN
INTRAMUSCULAR | Status: DC | PRN
  Administered 2018-08-12: 4 mg via INTRAVENOUS

## 2018-08-12 MED ORDER — MEPERIDINE HCL 25 MG/ML IJ SOLN
6.2500 mg | INTRAMUSCULAR | Status: DC | PRN
  Filled 2018-08-12: qty 1

## 2018-08-12 MED ORDER — LIDOCAINE 5 % EX OINT
TOPICAL_OINTMENT | CUTANEOUS | Status: DC | PRN
  Administered 2018-08-12: 1

## 2018-08-12 MED ORDER — BUPIVACAINE-EPINEPHRINE 0.5% -1:200000 IJ SOLN
INTRAMUSCULAR | Status: DC | PRN
  Administered 2018-08-12: 30 mL

## 2018-08-12 MED ORDER — ACETAMINOPHEN 500 MG PO TABS
1000.0000 mg | ORAL_TABLET | ORAL | Status: AC
  Administered 2018-08-12: 1000 mg via ORAL
  Filled 2018-08-12: qty 2

## 2018-08-12 MED ORDER — ONDANSETRON HCL 4 MG/2ML IJ SOLN
4.0000 mg | Freq: Once | INTRAMUSCULAR | Status: DC | PRN
  Filled 2018-08-12: qty 2

## 2018-08-12 MED ORDER — HYDROMORPHONE HCL 1 MG/ML IJ SOLN
0.2500 mg | INTRAMUSCULAR | Status: DC | PRN
  Filled 2018-08-12: qty 0.5

## 2018-08-12 MED ORDER — PROPOFOL 10 MG/ML IV BOLUS
INTRAVENOUS | Status: DC | PRN
  Administered 2018-08-12: 30 mg via INTRAVENOUS
  Administered 2018-08-12: 20 mg via INTRAVENOUS

## 2018-08-12 MED ORDER — KETOROLAC TROMETHAMINE 30 MG/ML IJ SOLN
INTRAMUSCULAR | Status: AC
  Filled 2018-08-12: qty 2

## 2018-08-12 MED ORDER — LACTATED RINGERS IV SOLN
INTRAVENOUS | Status: DC
  Administered 2018-08-12: 06:00:00 via INTRAVENOUS
  Filled 2018-08-12: qty 1000

## 2018-08-12 MED ORDER — PROPOFOL 500 MG/50ML IV EMUL
INTRAVENOUS | Status: AC
  Filled 2018-08-12: qty 50

## 2018-08-12 MED ORDER — DEXAMETHASONE SODIUM PHOSPHATE 10 MG/ML IJ SOLN
INTRAMUSCULAR | Status: AC
  Filled 2018-08-12: qty 1

## 2018-08-12 MED ORDER — SODIUM CHLORIDE 0.9 % IV SOLN
250.0000 mL | INTRAVENOUS | Status: DC | PRN
  Filled 2018-08-12: qty 250

## 2018-08-12 MED ORDER — KETOROLAC TROMETHAMINE 30 MG/ML IJ SOLN
INTRAMUSCULAR | Status: DC | PRN
  Administered 2018-08-12: 30 mg via INTRAVENOUS

## 2018-08-12 SURGICAL SUPPLY — 52 items
BENZOIN TINCTURE PRP APPL 2/3 (GAUZE/BANDAGES/DRESSINGS) ×2 IMPLANT
BLADE CLIPPER SURG (BLADE) IMPLANT
BLADE EXTENDED COATED 6.5IN (ELECTRODE) IMPLANT
BLADE SURG 10 STRL SS (BLADE) ×2 IMPLANT
CHLORAPREP W/TINT 26ML (MISCELLANEOUS) IMPLANT
COVER BACK TABLE 60X90IN (DRAPES) ×2 IMPLANT
COVER MAYO STAND STRL (DRAPES) ×2 IMPLANT
COVER WAND RF STERILE (DRAPES) IMPLANT
DECANTER SPIKE VIAL GLASS SM (MISCELLANEOUS) IMPLANT
DERMABOND ADVANCED (GAUZE/BANDAGES/DRESSINGS)
DERMABOND ADVANCED .7 DNX12 (GAUZE/BANDAGES/DRESSINGS) IMPLANT
DRAPE LAPAROTOMY 100X72 PEDS (DRAPES) ×2 IMPLANT
DRAPE UTILITY XL STRL (DRAPES) ×2 IMPLANT
DRSG TEGADERM 4X4.75 (GAUZE/BANDAGES/DRESSINGS) IMPLANT
ELECT REM PT RETURN 9FT ADLT (ELECTROSURGICAL) ×2
ELECTRODE REM PT RTRN 9FT ADLT (ELECTROSURGICAL) ×1 IMPLANT
GAUZE SPONGE 4X4 12PLY STRL (GAUZE/BANDAGES/DRESSINGS) ×2 IMPLANT
GLOVE BIO SURGEON STRL SZ 6.5 (GLOVE) IMPLANT
GLOVE BIOGEL PI IND STRL 6.5 (GLOVE) ×1 IMPLANT
GLOVE BIOGEL PI IND STRL 7.0 (GLOVE) ×1 IMPLANT
GLOVE BIOGEL PI IND STRL 7.5 (GLOVE) ×1 IMPLANT
GLOVE BIOGEL PI INDICATOR 6.5 (GLOVE) ×1
GLOVE BIOGEL PI INDICATOR 7.0 (GLOVE) ×1
GLOVE BIOGEL PI INDICATOR 7.5 (GLOVE) ×1
GOWN STRL REUS W/ TWL XL LVL3 (GOWN DISPOSABLE) ×1 IMPLANT
GOWN STRL REUS W/TWL 2XL LVL3 (GOWN DISPOSABLE) ×2 IMPLANT
GOWN STRL REUS W/TWL XL LVL3 (GOWN DISPOSABLE) ×1
KIT TURNOVER CYSTO (KITS) ×2 IMPLANT
NEEDLE HYPO 22GX1.5 SAFETY (NEEDLE) ×2 IMPLANT
NS IRRIG 500ML POUR BTL (IV SOLUTION) IMPLANT
PAD ABD 8X10 STRL (GAUZE/BANDAGES/DRESSINGS) ×2 IMPLANT
PAD ARMBOARD 7.5X6 YLW CONV (MISCELLANEOUS) IMPLANT
PENCIL BUTTON HOLSTER BLD 10FT (ELECTRODE) ×2 IMPLANT
STRIP CLOSURE SKIN 1/2X4 (GAUZE/BANDAGES/DRESSINGS) IMPLANT
SUT CHROMIC 3 0 SH 27 (SUTURE) ×4 IMPLANT
SUT ETHILON 2 0 FS 18 (SUTURE) IMPLANT
SUT ETHILON 4 0 PS 2 18 (SUTURE) IMPLANT
SUT SILK 2 0 SH (SUTURE) IMPLANT
SUT VIC AB 2-0 SH 27 (SUTURE)
SUT VIC AB 2-0 SH 27XBRD (SUTURE) IMPLANT
SUT VIC AB 3-0 SH 18 (SUTURE) IMPLANT
SUT VIC AB 4-0 SH 18 (SUTURE) IMPLANT
SUT VICRYL 4-0 PS2 18IN ABS (SUTURE) IMPLANT
SYR BULB IRRIGATION 50ML (SYRINGE) ×2 IMPLANT
SYR CONTROL 10ML LL (SYRINGE) ×2 IMPLANT
TOWEL OR 17X24 6PK STRL BLUE (TOWEL DISPOSABLE) ×2 IMPLANT
TRAY DSU PREP LF (CUSTOM PROCEDURE TRAY) ×2 IMPLANT
TUBE ANAEROBIC SPECIMEN COL (MISCELLANEOUS) IMPLANT
TUBE CONNECTING 12X1/4 (SUCTIONS) ×2 IMPLANT
UNDERPAD 30X30 (UNDERPADS AND DIAPERS) ×2 IMPLANT
WATER STERILE IRR 500ML POUR (IV SOLUTION) ×2 IMPLANT
YANKAUER SUCT BULB TIP NO VENT (SUCTIONS) ×2 IMPLANT

## 2018-08-12 NOTE — Anesthesia Preprocedure Evaluation (Addendum)
Anesthesia Evaluation  Patient identified by MRN, date of birth, ID band Patient awake    Reviewed: Allergy & Precautions, NPO status , Patient's Chart, lab work & pertinent test results  Airway Mallampati: I  TM Distance: >3 FB Neck ROM: Full    Dental   Pulmonary former smoker,    Pulmonary exam normal        Cardiovascular hypertension, Pt. on medications Normal cardiovascular exam+ dysrhythmias Atrial Fibrillation      Neuro/Psych    GI/Hepatic   Endo/Other    Renal/GU      Musculoskeletal   Abdominal   Peds  Hematology   Anesthesia Other Findings   Reproductive/Obstetrics                             Anesthesia Physical Anesthesia Plan  ASA: III  Anesthesia Plan: General   Post-op Pain Management:    Induction: Intravenous  PONV Risk Score and Plan: 3 and Ondansetron, Midazolam and Treatment may vary due to age or medical condition  Airway Management Planned: Simple Face Mask  Additional Equipment:   Intra-op Plan:   Post-operative Plan:   Informed Consent: I have reviewed the patients History and Physical, chart, labs and discussed the procedure including the risks, benefits and alternatives for the proposed anesthesia with the patient or authorized representative who has indicated his/her understanding and acceptance.     Plan Discussed with: CRNA and Surgeon  Anesthesia Plan Comments:         Anesthesia Quick Evaluation

## 2018-08-12 NOTE — Anesthesia Procedure Notes (Addendum)
Procedure Name: MAC Date/Time: 08/12/2018 7:23 AM Performed by: Justice Rocher, CRNA Pre-anesthesia Checklist: Patient identified, Emergency Drugs available, Suction available, Patient being monitored and Timeout performed Patient Re-evaluated:Patient Re-evaluated prior to induction Oxygen Delivery Method: Simple face mask Preoxygenation: Pre-oxygenation with 100% oxygen Induction Type: IV induction Placement Confirmation: breath sounds checked- equal and bilateral and positive ETCO2

## 2018-08-12 NOTE — Addendum Note (Signed)
Addendum  created 08/12/18 1418 by Jessica PriestBeeson, Humna Moorehouse C, CRNA   Child order released for a procedure order, Clinical Note Signed, Intraprocedure Blocks edited, Order Canceled from Note

## 2018-08-12 NOTE — Anesthesia Postprocedure Evaluation (Addendum)
Anesthesia Post Note  Patient: Julia Carr  Procedure(s) Performed: EXCISION OF ANAL SKINTAG (N/A Perineum)     Patient location during evaluation: PACU Anesthesia Type: MAC Level of consciousness: awake and alert Pain management: pain level controlled Vital Signs Assessment: post-procedure vital signs reviewed and stable Respiratory status: spontaneous breathing, nonlabored ventilation, respiratory function stable and patient connected to nasal cannula oxygen Cardiovascular status: stable and blood pressure returned to baseline Postop Assessment: no apparent nausea or vomiting Anesthetic complications: no    Last Vitals:  Vitals:   08/12/18 0830 08/12/18 0924  BP: 119/74 105/76  Pulse: (!) 57 61  Resp: 14 16  Temp:  36.6 C  SpO2: 99% 100%    Last Pain:  Vitals:   08/12/18 0924  TempSrc: Oral  PainSc:                  Julia Carr DAVID

## 2018-08-12 NOTE — Op Note (Addendum)
08/12/2018  7:44 AM  PATIENT:  Julia Carr  62 y.o. female  Patient Care Team: Debbrah Alar, NP as PCP - General (Internal Medicine)  PRE-OPERATIVE DIAGNOSIS:  ANAL SKIN TAG  POST-OPERATIVE DIAGNOSIS:  Anal skin tag  PROCEDURE:  ANAL EXAM UNDER ANESTHESIA, EXCISION OF ANAL SKIN TAG    Surgeon(s): Leighton Ruff, MD  ASSISTANT: none   ANESTHESIA:   local and MAC  SPECIMEN:  Source of Specimen:  anal skin tag  DISPOSITION OF SPECIMEN:  PATHOLOGY  COUNTS:  YES  PLAN OF CARE: Discharge to home after PACU  PATIENT DISPOSITION:  PACU - hemodynamically stable.  INDICATION: 62 y.o. F with chronically inflamed posterior anal skin tag   OR FINDINGS: anal skin tags, external hemorrhoids, min internal disease  DESCRIPTION: the patient was identified in the preoperative holding area and taken to the OR where they were laid on the operating room table.  MAC anesthesia was induced without difficulty. The patient was then positioned in prone jackknife position with buttocks gently taped apart.  The patient was then prepped and draped in usual sterile fashion.  SCDs were noted to be in place prior to the initiation of anesthesia. A surgical timeout was performed indicating the correct patient, procedure, positioning and need for preoperative antibiotics.  A rectal block was performed using Marcaine with epinephrine.    I began with a digital rectal exam.  There were no masses palpated.  I then placed a Hill-Ferguson anoscope into the anal canal and evaluated this completely.   There was minimal internal hemorrhoid disease.  There was a large posterior midline skin tag and some smaller external hemorrhoids noted externally.  I incised the skin tag using a 10 blade scalpel.  Dissection was careful to avoid the underlying sphincter complex.  I then closed the defect using interrupted 3-0 chromic sutures.  Additional Marcaine was placed around the incision.  There was good hemostasis.  A  sterile dressing was applied.  The patient was then awakened and sent to the postanesthesia care unit in stable condition.  All counts were correct per operating room staff.

## 2018-08-12 NOTE — Discharge Instructions (Addendum)
ANORECTAL SURGERY: POST OP INSTRUCTIONS °1. Take your usually prescribed home medications unless otherwise directed. °2. DIET: During the first few hours after surgery sip on some liquids until you are able to urinate.  It is normal to not urinate for several hours after this surgery.  If you feel uncomfortable, please contact the office for instructions.  After you are able to urinate,you may eat, if you feel like it.  Follow a light bland diet the first 24 hours after arrival home, such as soup, liquids, crackers, etc.  Be sure to include lots of fluids daily (6-8 glasses).  Avoid fast food or heavy meals, as your are more likely to get nauseated.  Eat a low fat diet the next few days after surgery.  Limit caffeine intake to 1-2 servings a day. °3. PAIN CONTROL: °a. Pain is best controlled by a usual combination of several different methods TOGETHER: °i. Muscle relaxation: Soak in a warm bath (or Sitz bath) three times a day and after bowel movements.  Continue to do this until all pain is resolved. °ii. Over the counter pain medication °iii. Prescription pain medication °b. Most patients will experience some swelling and discomfort in the anus/rectal area and incisions.  Heat such as warm towels, sitz baths, warm baths, etc to help relax tight/sore spots and speed recovery.  Some people prefer to use ice, especially in the first couple days after surgery, as it may decrease the pain and swelling, or alternate between ice & heat.  Experiment to what works for you.  Swelling and bruising can take several weeks to resolve.  Pain can take even longer to completely resolve. °c. It is helpful to take an over-the-counter pain medication regularly for the first few weeks.  Choose one of the following that works best for you: °i. Naproxen (Aleve, etc)  Two 220mg tabs twice a day °ii. Ibuprofen (Advil, etc) Three 200mg tabs four times a day (every meal & bedtime) °d. A  prescription for pain medication (such as percocet,  oxycodone, hydrocodone, etc) should be given to you upon discharge.  Take your pain medication as prescribed.  °i. If you are having problems/concerns with the prescription medicine (does not control pain, nausea, vomiting, rash, itching, etc), please call us [phone] to see if we need to switch you to a different pain medicine that will work better for you and/or control your side effect better. °ii. If you need a refill on your pain medication, please contact your pharmacy.  They will contact our office to request authorization. Prescriptions will not be filled after 5 pm or on week-ends. °4. KEEP YOUR BOWELS REGULAR and AVOID CONSTIPATION °a. The goal is one to two soft bowel movements a day.  You should at least have a bowel movement every other day. °b. Avoid getting constipated.  Between the surgery and the pain medications, it is common to experience some constipation. This can be very painful after rectal surgery.  Increasing fluid intake and taking a fiber supplement (such as Metamucil, Citrucel, FiberCon, etc) 1-2 times a day regularly will usually help prevent this problem from occurring.  A stool softener like colace is also recommended.  This can be purchased over the counter at your pharmacy.  You can take it up to 3 times a day.  If you do not have a bowel movement after 24 hrs since your surgery, take one does of milk of magnesia.  If you still haven't had a bowel movement 8-12 hours after   that dose, take another dose.  If you don't have a bowel movement 48 hrs after surgery, purchase a Fleets enema from the drug store and administer gently per package instructions.  If you still are having trouble with your bowel movements after that, please call the office for further instructions. °c. If you develop diarrhea or have many loose bowel movements, simplify your diet to bland foods & liquids for a few days.  Stop any stool softeners and decrease your fiber supplement.  Switching to mild  anti-diarrheal medications (Kayopectate, Pepto Bismol) can help.  If this worsens or does not improve, please call us. ° °5. Wound Care °a. Remove your bandages before your first bowel movement or 8 hours after surgery.     °b. Remove any wound packing material at this tim,e as well.  You do not need to repack the wound unless instructed otherwise.  Wear an absorbent pad or soft cotton gauze in your underwear to catch any drainage and help keep the area clean. You should change this every 2-3 hours while awake. °c. Keep the area clean and dry.  Bathe / shower every day, especially after bowel movements.  Keep the area clean by showering / bathing over the incision / wound.   It is okay to soak an open wound to help wash it.  Wet wipes or showers / gentle washing after bowel movements is often less traumatic than regular toilet paper. °d. You may have some styrofoam-like soft packing in the rectum which will come out with the first bowel movement.  °e. You will often notice bleeding with bowel movements.  This should slow down by the end of the first week of surgery °f. Expect some drainage.  This should slow down, too, by the end of the first week of surgery.  Wear an absorbent pad or soft cotton gauze in your underwear until the drainage stops. °g. Do Not sit on a rubber or pillow ring.  This can make you symptoms worse.  You may sit on a soft pillow if needed.  °6. ACTIVITIES as tolerated:   °a. You may resume regular (light) daily activities beginning the next day--such as daily self-care, walking, climbing stairs--gradually increasing activities as tolerated.  If you can walk 30 minutes without difficulty, it is safe to try more intense activity such as jogging, treadmill, bicycling, low-impact aerobics, swimming, etc. °b. Save the most intensive and strenuous activity for last such as sit-ups, heavy lifting, contact sports, etc  Refrain from any heavy lifting or straining until you are off narcotics for pain  control.   °c. You may drive when you are no longer taking prescription pain medication, you can comfortably sit for long periods of time, and you can safely maneuver your car and apply brakes. °d. You may have sexual intercourse when it is comfortable.  °7. FOLLOW UP in our office °a. Please call [facility] at [phone] to set up an appointment to see your surgeon in the office for a follow-up appointment approximately 3-4 weeks after your surgery. °b. Make sure that you call for this appointment the day you arrive home to insure a convenient appointment time. °10. IF YOU HAVE DISABILITY OR FAMILY LEAVE FORMS, BRING THEM TO THE OFFICE FOR PROCESSING.  DO NOT GIVE THEM TO YOUR DOCTOR. ° ° ° ° °WHEN TO CALL US [phone]: °1. Poor pain control °2. Reactions / problems with new medications (rash/itching, nausea, etc)  °3. Fever over 101.5 F (38.5 C) °4.   Inability to urinate 5. Nausea and/or vomiting 6. Worsening swelling or bruising 7. Continued bleeding from incision. 8. Increased pain, redness, or drainage from the incision  The clinic staff is available to answer your questions during regular business hours (8:30am-5pm).  Please dont hesitate to call and ask to speak to one of our nurses for clinical concerns.   [facility] surgeon [facility] is always on call at the hospitals   If you have a medical emergency, go to the nearest emergency room or call 911.    [facility] [address], [address], [place], [place]  [address] ? MAIN: [phone]) [phone] ? TOLL FREE: [phone] ? FAX [phone] [email or web address]   Post Anesthesia Home Care Instructions  Activity: Get plenty of rest for the remainder of the day. A responsible adult should stay with you for 24 hours following the procedure.  For the next 24 hours, DO NOT: -Drive a car -Advertising copywriterperate machinery -Drink alcoholic beverages -Take any medication unless instructed by your physician -Make  any legal decisions or sign important papers.  Meals: Start with liquid foods such as gelatin or soup. Progress to regular foods as tolerated. Avoid greasy, spicy, heavy foods. If nausea and/or vomiting occur, drink only clear liquids until the nausea and/or vomiting subsides. Call your physician if vomiting continues.  Special Instructions/Symptoms: Your throat may feel dry or sore from the anesthesia or the breathing tube placed in your throat during surgery. If this causes discomfort, gargle with warm salt water. The discomfort should disappear within 24 hours.  If you had a scopolamine patch placed behind your ear for the management of post- operative nausea and/or vomiting:  1. The medication in the patch is effective for 72 hours, after which it should be removed.  Wrap patch in a tissue and discard in the trash. Wash hands thoroughly with soap and water. 2. You may remove the patch earlier than 72 hours if you experience unpleasant side effects which may include dry mouth, dizziness or visual disturbances. 3. Avoid touching the patch. Wash your hands with soap and water after contact with the patch.  NO ADVIL, ALEVE, MOTRIN, IBUPROFEN UNTIL 3:30 PM TODAY

## 2018-08-12 NOTE — Transfer of Care (Signed)
Immediate Anesthesia Transfer of Care Note  Patient: Julia Carr  Procedure(s) Performed: Procedure(s) (LRB): EXCISION OF ANAL SKINTAG (N/A)  Patient Location: PACU  Anesthesia Type: General  Level of Consciousness: awake, sedated, patient cooperative and responds to stimulation  Airway & Oxygen Therapy: Patient Spontanous Breathing and Patient on Room air - pt awake  Post-op Assessment: Report given to PACU RN, Post -op Vital signs reviewed and stable and Patient moving all extremities  Post vital signs: Reviewed and stable  Complications: No apparent anesthesia complications

## 2018-08-12 NOTE — H&P (Signed)
The patient is a 62 year old female who presents with a complaint of anal problems. 62 year old female who presents to the office for evaluation of an anal skin tag. She has had some difficulty with hemorrhoids over the past couple years. A colonoscopy was performed which was normal approximately 5 years ago. She saw Dr. Lavon PaganiniNandigam in the office and rubber band ligation was attempted on 2 separate occasions. This has gotten rid of most of her symptoms but she continues to have difficulty with wiping and anal hygiene due to an enlarged external skin tag. She was referred to me for further evaluation and possible excision.   Past Surgical History [name](Kelly Dockery, LPN; 2/84/13249/30/2019 40:1010:39 AM) Oral Surgery  Diagnostic Studies History [name](Kelly Dockery, LPN; 2/72/53669/30/2019 44:0310:39 AM) Colonoscopy 5-10 years ago Mammogram 1-3 years ago Pap Smear 1-5 years ago  Allergies [name](Kelly Dockery, LPN; 4/74/25959/30/2019 63:8710:30 AM) No Known Drug Allergies [05/31/2018]:  Medication History [name](Kelly Dockery, LPN; 5/64/33299/30/2019 51:8810:30 AM) Multivitamin (Oral) Active. Flaxseed Active. Omega 3 (1000MG  Capsule, Oral) Active. Medications Reconciled  Social History [name](Kelly Dockery, LPN; 4/16/60639/30/2019 01:6010:39 AM) Alcohol use Occasional alcohol use. No caffeine use No drug use Tobacco use Former smoker.  Family History [name](Kelly Dockery, LPN; 1/09/32359/30/2019 57:3210:39 AM) Family history unknown First Degree Relatives  Pregnancy / Birth History [name](Kelly Dockery, LPN; 2/02/54279/30/2019 06:2310:39 AM) Age at menarche 17 years. Age of menopause 1751-55 Gravida 1 Irregular periods Maternal age 60-30 Para 1  Other Problems [name](Kelly Dockery, LPN; 7/62/83159/30/2019 17:6110:39 AM) Atrial Fibrillation Back Pain Gastroesophageal Reflux Disease Hemorrhoids High blood pressure     Review of Systems General Not Present- Appetite Loss, Chills, Fatigue, Fever, Night Sweats, Weight Gain and Weight Loss. Skin Not Present- Change in Wart/Mole, Dryness,  Hives, Jaundice, New Lesions, Non-Healing Wounds, Rash and Ulcer. HEENT Present- Wears glasses/contact lenses. Not Present- Earache, Hearing Loss, Hoarseness, Nose Bleed, Oral Ulcers, Ringing in the Ears, Seasonal Allergies, Sinus Pain, Sore Throat, Visual Disturbances and Yellow Eyes. Respiratory Not Present- Bloody sputum, Chronic Cough, Difficulty Breathing, Snoring and Wheezing. Breast Not Present- Breast Mass, Breast Pain, Nipple Discharge and Skin Changes. Cardiovascular Not Present- Chest Pain, Difficulty Breathing Lying Down, Leg Cramps, Palpitations, Rapid Heart Rate, Shortness of Breath and Swelling of Extremities. Gastrointestinal Not Present- Abdominal Pain, Bloating, Bloody Stool, Change in Bowel Habits, Chronic diarrhea, Constipation, Difficulty Swallowing, Excessive gas, Gets full quickly at meals, Hemorrhoids, Indigestion, Nausea, Rectal Pain and Vomiting. Female Genitourinary Not Present- Frequency, Nocturia, Painful Urination, Pelvic Pain and Urgency. Musculoskeletal Not Present- Back Pain, Joint Pain, Joint Stiffness, Muscle Pain, Muscle Weakness and Swelling of Extremities. Neurological Not Present- Decreased Memory, Fainting, Headaches, Numbness, Seizures, Tingling, Tremor, Trouble walking and Weakness. Psychiatric Not Present- Anxiety, Bipolar, Change in Sleep Pattern, Depression, Fearful and Frequent crying. Endocrine Not Present- Cold Intolerance, Excessive Hunger, Hair Changes, Heat Intolerance, Hot flashes and New Diabetes. Hematology Not Present- Blood Thinners, Easy Bruising, Excessive bleeding, Gland problems, HIV and Persistent Infections.  BP (!) 140/94   Pulse 63   Temp 97.7 F (36.5 C) (Oral)   Resp 16   Ht 5\' 8"  (1.727 m)   Wt 68.6 kg   SpO2 100%   BMI 23.01 kg/m     Physical Exam  General Mental Status-Alert. General Appearance-Not in acute distress. Build & Nutrition-Well nourished. Posture-Normal posture. Gait-Normal.  Head and  Neck Head-normocephalic, atraumatic with no lesions or palpable masses. Trachea-midline.  Chest and Lung Exam Chest and lung exam reveals -on auscultation, normal breath sounds, no adventitious sounds and normal vocal  resonance.  Cardiovascular Cardiovascular examination reveals -normal heart sounds, regular rate and rhythm with no murmurs and no digital clubbing, cyanosis, edema, increased warmth or tenderness.  Abdomen Inspection Inspection of the abdomen reveals - No Hernias. Palpation/Percussion Palpation and Percussion of the abdomen reveal - Soft, Non Tender, No Rigidity (guarding), No hepatosplenomegaly and No Palpable abdominal masses.  Rectal Anorectal Exam External - skin tag(Posterior midline skin tag with chronic inflammation).  Neurologic Neurologic evaluation reveals -alert and oriented x 3 with no impairment of recent or remote memory, normal attention span and ability to concentrate, normal sensation and normal coordination.  Musculoskeletal Normal Exam - Bilateral-Upper Extremity Strength Normal and Lower Extremity Strength Normal.    Assessment & Plan   ANAL SKIN TAG (K64.4) Impression: 62 year old female who presents to the office with an enlarging anal skin tag. This is been present for many years but is beginning to cause her difficulty with hygiene. On exam she has an elongated posterior midline skin tag which appears chronically inflamed. We have discussed excision including postoperative pain and restrictions. She has decided to proceed with this.

## 2018-08-13 ENCOUNTER — Encounter (HOSPITAL_BASED_OUTPATIENT_CLINIC_OR_DEPARTMENT_OTHER): Payer: Self-pay | Admitting: General Surgery

## 2019-05-11 DIAGNOSIS — Z20828 Contact with and (suspected) exposure to other viral communicable diseases: Secondary | ICD-10-CM | POA: Diagnosis not present

## 2019-07-14 ENCOUNTER — Other Ambulatory Visit: Payer: Self-pay

## 2019-07-15 ENCOUNTER — Encounter: Payer: Self-pay | Admitting: Family

## 2019-07-15 ENCOUNTER — Other Ambulatory Visit: Payer: Self-pay

## 2019-07-15 ENCOUNTER — Ambulatory Visit (INDEPENDENT_AMBULATORY_CARE_PROVIDER_SITE_OTHER): Payer: BC Managed Care – PPO | Admitting: Family

## 2019-07-15 VITALS — BP 122/70 | HR 61 | Temp 97.1°F | Resp 16 | Ht 68.0 in | Wt 147.0 lb

## 2019-07-15 DIAGNOSIS — Z0000 Encounter for general adult medical examination without abnormal findings: Secondary | ICD-10-CM

## 2019-07-15 LAB — HEPATIC FUNCTION PANEL
ALT: 16 U/L (ref 0–35)
AST: 19 U/L (ref 0–37)
Albumin: 4.2 g/dL (ref 3.5–5.2)
Alkaline Phosphatase: 71 U/L (ref 39–117)
Bilirubin, Direct: 0.1 mg/dL (ref 0.0–0.3)
Total Bilirubin: 0.7 mg/dL (ref 0.2–1.2)
Total Protein: 7 g/dL (ref 6.0–8.3)

## 2019-07-15 LAB — CBC WITH DIFFERENTIAL/PLATELET
Basophils Absolute: 0 10*3/uL (ref 0.0–0.1)
Basophils Relative: 0.9 % (ref 0.0–3.0)
Eosinophils Absolute: 0.1 10*3/uL (ref 0.0–0.7)
Eosinophils Relative: 1.8 % (ref 0.0–5.0)
HCT: 39.7 % (ref 36.0–46.0)
Hemoglobin: 13.2 g/dL (ref 12.0–15.0)
Lymphocytes Relative: 33.5 % (ref 12.0–46.0)
Lymphs Abs: 1.5 10*3/uL (ref 0.7–4.0)
MCHC: 33.3 g/dL (ref 30.0–36.0)
MCV: 89 fl (ref 78.0–100.0)
Monocytes Absolute: 0.4 10*3/uL (ref 0.1–1.0)
Monocytes Relative: 9.2 % (ref 3.0–12.0)
Neutro Abs: 2.5 10*3/uL (ref 1.4–7.7)
Neutrophils Relative %: 54.6 % (ref 43.0–77.0)
Platelets: 272 10*3/uL (ref 150.0–400.0)
RBC: 4.46 Mil/uL (ref 3.87–5.11)
RDW: 13.1 % (ref 11.5–15.5)
WBC: 4.5 10*3/uL (ref 4.0–10.5)

## 2019-07-15 LAB — LIPID PANEL
Cholesterol: 172 mg/dL (ref 0–200)
HDL: 45.2 mg/dL (ref >39.00)
LDL Cholesterol: 110 mg/dL — ABNORMAL HIGH (ref 0–99)
NonHDL: 126.49
Total CHOL/HDL Ratio: 4
Triglycerides: 84 mg/dL (ref 0.0–149.0)
VLDL: 16.8 mg/dL (ref 0.0–40.0)

## 2019-07-15 LAB — BASIC METABOLIC PANEL
BUN: 22 mg/dL (ref 6–23)
CO2: 30 mEq/L (ref 19–32)
Calcium: 9 mg/dL (ref 8.4–10.5)
Chloride: 105 mEq/L (ref 96–112)
Creatinine, Ser: 0.83 mg/dL (ref 0.40–1.20)
GFR: 69.79 mL/min (ref >60.00)
Glucose, Bld: 87 mg/dL (ref 70–99)
Potassium: 4.1 mEq/L (ref 3.5–5.1)
Sodium: 141 mEq/L (ref 135–145)

## 2019-07-15 LAB — TSH: TSH: 2.65 u[IU]/mL (ref 0.35–4.50)

## 2019-07-15 NOTE — Progress Notes (Signed)
Subjective:    Patient ID: Julia Carr, female    DOB: 04-13-1956, 63 y.o.   MRN: [id number]  HPI  Patient presents today for complete physical.  Immunizations: Tdap 2013, declines flu shot  Diet: healthy Exercise:  Runs regularly Colonoscopy:  2014 Dexa: 2017- normal Pap Smear: 07/23/18 Mammogram: will complete at work next week Vision: up to date Dental: up to date    Review of Systems  Constitutional: Negative for unexpected weight change.  HENT: Negative for hearing loss and rhinorrhea.   Eyes: Negative for visual disturbance.  Respiratory: Negative for cough and shortness of breath.   Cardiovascular: Negative for chest pain.  Gastrointestinal: Negative for blood in stool, constipation and diarrhea.  Genitourinary: Negative for dysuria, frequency and hematuria.  Musculoskeletal: Negative for arthralgias and myalgias.  Skin: Negative for rash.  Neurological: Negative for headaches.  Hematological: Negative for adenopathy.  Psychiatric/Behavioral:       Denies depression/anxiety      Past Medical History:  Diagnosis Date  . Anal skin tag   . Hemorrhoids   . History of pneumothorax    treated with chest tube  . Labile hypertension    followed by cardiology (dr fitzgerald)  . PAF (paroxysmal atrial fibrillation) [facility])    cardiologist-  dr Shanon Brow fitzgerald [facility] in [place])---  08/2015 s/p cryo balloon ablation for fib/flutter   . Wears contact lenses      Social History   Socioeconomic History  . Marital status: Single    Spouse name: Not on file  . Number of children: Not on file  . Years of education: Not on file  . Highest education level: Not on file  Occupational History  . Not on file  Social Needs  . Financial resource strain: Not on file  . Food insecurity    Worry: Not on file    Inability: Not on file  . Transportation needs    Medical: Not on file    Non-medical: Not on file  Tobacco Use  . Smoking status: Former Smoker     Packs/day: 0.50    Years: 25.00    Pack years: 12.50    Types: Cigarettes    Quit date: 08/06/2008    Years since quitting: 10.9  . Smokeless tobacco: Never Used  Substance and Sexual Activity  . Alcohol use: Yes    Comment: occasional  . Drug use: No  . Sexual activity: Not on file  Lifestyle  . Physical activity    Days per week: Not on file    Minutes per session: Not on file  . Stress: Not on file  Relationships  . Social Herbalist on phone: Not on file    Gets together: Not on file    Attends religious service: Not on file    Active member of club or organization: Not on file    Attends meetings of clubs or organizations: Not on file    Relationship status: Not on file  . Intimate partner violence    Fear of current or ex partner: Not on file    Emotionally abused: Not on file    Physically abused: Not on file    Forced sexual activity: Not on file  Other Topics Concern  . 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    One dog  Past Surgical History:  Procedure Laterality Date  . CARDIAC ELECTROPHYSIOLOGY STUDY AND ABLATION  12/ 2016    dr Onalee Hua fitzgerald  [email or web address]   . COLONOSCOPY  last one 2014 approx.  MASS EXCISION N/A 08/12/2018   Procedure: EXCISION OF ANAL SKINTAG;  Surgeon: 14/08/2018, MD;  Location: [facility] [place];  Service: General;  Laterality: N/A;    Family History  Problem Relation Age of Onset  . Cancer Mother        ovarian  . Diabetes Mother        type II  . Alcohol abuse Father   . Hypertension Father   . Hypertension Sister   . Cancer Paternal Grandmother        breast  . Heart disease Paternal Grandmother   . Alcohol abuse Paternal Grandfather   . Cancer Paternal Aunt        breast  . Heart murmur Sister   . Osteoarthritis Sister     Allergies  Allergen Reactions  . Latex Hives     Current Outpatient Medications on File Prior to Visit  Medication Sig Dispense Refill  . Calcium Carbonate-Vitamin D (CALTRATE 600+D) 600-400 MG-UNIT tablet Take 1 tablet by mouth 2 (two) times daily.    . Multiple Vitamin (MULTIVITAMIN PO) Take by mouth daily.     . Nutritional Supplements (SALMON OIL) CAPS Take 1 capsule by mouth daily.    . traMADol (ULTRAM) 50 MG tablet Take 1 tablet (50 mg total) by mouth every 6 (six) hours as needed. 10 tablet 0   No current facility-administered medications on file prior to visit.     BP 122/70 (BP Location: Right Arm, Patient Position: Sitting, Cuff Size: Normal)   Pulse 61   Temp (!) 97.1 F (36.2 C) (Temporal)   Resp 16   Ht 5\' 8"  (1.727 m)   Wt 147 lb (66.7 kg)   SpO2 98%   BMI 22.35 kg/m       Objective:   Physical Exam  Physical Exam  Constitutional: She is oriented to person, place, and time. She appears well-developed and well-nourished. No distress.  HENT:  Head: Normocephalic and atraumatic.  Right Ear: Tympanic membrane and ear canal normal.  Left Ear: Tympanic membrane and ear canal normal.  Mouth/Throat: Oropharynx is clear and moist.  Eyes: Pupils are equal, round, and reactive to light. No scleral icterus.  Neck: Normal range of motion. No thyromegaly present.  Cardiovascular: Normal rate and regular rhythm.   No murmur heard. Pulmonary/Chest: Effort normal and breath sounds normal. No respiratory distress. He has no wheezes. She has no rales. She exhibits no tenderness.  Abdominal: Soft. Bowel sounds are normal. She exhibits no distension and no mass. There is no tenderness. There is no rebound and no guarding.  Musculoskeletal: She exhibits no edema.  Lymphadenopathy:    She has no cervical adenopathy.  Neurological: She is alert and oriented to person, place, and time. She has normal patellar reflexes. She exhibits normal muscle tone. Coordination normal.  Skin: Skin is warm and dry.  Psychiatric: She has a  normal mood and affect. Her behavior is normal. Judgment and thought content normal.            Assessment & Plan:   Preventative care- encouraged pt to continue healthy diet, regular exercise. Refer for mammogram.  Declines flu shot, declines shingrix.  Tdap up to date.  Obtain routine lab work.        Assessment & Plan:

## 2019-07-15 NOTE — Patient Instructions (Addendum)
Please complete lab work prior to leaving.  Continue healthy diet and regular exercise.  

## 2019-08-01 ENCOUNTER — Encounter (HOSPITAL_BASED_OUTPATIENT_CLINIC_OR_DEPARTMENT_OTHER): Payer: Self-pay

## 2019-08-01 ENCOUNTER — Other Ambulatory Visit: Payer: Self-pay

## 2019-08-01 ENCOUNTER — Ambulatory Visit (HOSPITAL_BASED_OUTPATIENT_CLINIC_OR_DEPARTMENT_OTHER)
Admission: RE | Admit: 2019-08-01 | Discharge: 2019-08-01 | Disposition: A | Discharge status: Home / Self Care | Payer: BC Managed Care – PPO | Source: Ambulatory Visit | Attending: Family | Admitting: Family

## 2019-08-01 DIAGNOSIS — Z0000 Encounter for general adult medical examination without abnormal findings: Secondary | ICD-10-CM

## 2019-08-01 DIAGNOSIS — Z1231 Encounter for screening mammogram for malignant neoplasm of breast: Secondary | ICD-10-CM | POA: Insufficient documentation

## 2019-10-26 DIAGNOSIS — Z03818 Encounter for observation for suspected exposure to other biological agents ruled out: Secondary | ICD-10-CM | POA: Diagnosis not present

## 2019-11-07 DIAGNOSIS — Z03818 Encounter for observation for suspected exposure to other biological agents ruled out: Secondary | ICD-10-CM | POA: Diagnosis not present

## 2020-04-12 DIAGNOSIS — Z20822 Contact with and (suspected) exposure to covid-19: Secondary | ICD-10-CM | POA: Diagnosis not present

## 2020-07-06 ENCOUNTER — Encounter: Payer: Self-pay | Admitting: Family

## 2020-07-06 ENCOUNTER — Ambulatory Visit (INDEPENDENT_AMBULATORY_CARE_PROVIDER_SITE_OTHER): Payer: BC Managed Care – PPO | Admitting: Family

## 2020-07-06 ENCOUNTER — Other Ambulatory Visit: Payer: Self-pay

## 2020-07-06 VITALS — BP 136/85 | HR 58 | Temp 98.4°F | Resp 16 | Ht 68.0 in | Wt 150.0 lb

## 2020-07-06 DIAGNOSIS — Z0000 Encounter for general adult medical examination without abnormal findings: Secondary | ICD-10-CM | POA: Diagnosis not present

## 2020-07-06 DIAGNOSIS — I480 Paroxysmal atrial fibrillation: Secondary | ICD-10-CM

## 2020-07-06 DIAGNOSIS — M8580 Other specified disorders of bone density and structure, unspecified site: Secondary | ICD-10-CM

## 2020-07-06 DIAGNOSIS — E348 Other specified endocrine disorders: Secondary | ICD-10-CM

## 2020-07-06 LAB — CBC WITH DIFFERENTIAL/PLATELET: Neutro Abs: 2264 cells/uL (ref 1500–7800)

## 2020-07-06 LAB — HEPATIC FUNCTION PANEL: Bilirubin, Direct: 0.2 mg/dL (ref 0.0–0.2)

## 2020-07-06 NOTE — Patient Instructions (Addendum)
Please complete lab work prior to leaving. Schedule mammogram/bone density downstairs in imaging. Continue your healthy diet and regular exercise.

## 2020-07-06 NOTE — Progress Notes (Signed)
Subjective:    Patient ID: Julia Carr, female    DOB: 21-Dec-1955, 63 y.o.   MRN: [id number]  HPI  Patient is a 64 yr old female who presents today for cpx.  Immunizations: tdap 2013, declines flu  Diet: healthy  Exercise: runs regularly Colonoscopy: due 2024 Dexa:  Due  Pap Smear: 11/19 Mammogram: 08/01/19     Review of Systems  Constitutional: Negative for unexpected weight change.  HENT: Negative for hearing loss and rhinorrhea.   Eyes: Negative for visual disturbance.  Respiratory: Negative for cough and shortness of breath.   Cardiovascular: Negative for chest pain.  Gastrointestinal: Negative for diarrhea.  Genitourinary: Negative for dysuria, frequency and hematuria.  Musculoskeletal: Negative for arthralgias and myalgias.  Neurological: Negative for headaches.  Hematological: Negative for adenopathy.  Psychiatric/Behavioral:       Denies depression/anxiety       Past Medical History:  Diagnosis Date  . Anal skin tag   . Hemorrhoids   . History of pneumothorax    treated with chest tube  . Labile hypertension    followed by cardiology (dr fitzgerald)  . PAF (paroxysmal atrial fibrillation) [facility])    cardiologist-  dr Onalee Hua fitzgerald [facility] in [facility])---  08/2015 s/p cryo balloon ablation for fib/flutter   . Wears contact lenses      Social History   Socioeconomic History  . Marital status: Married    Spouse name: Not on file  . Number of children: Not on file  . Years of education: Not on file  . Highest education level: Not on file  Occupational History  . Not on file  Tobacco Use  . Smoking status: Former Smoker    Packs/day: 0.50    Years: 25.00    Pack years: 12.50    Types: Cigarettes    Quit date: 08/06/2008    Years since quitting: 11.9  . Smokeless tobacco: Never Used  Vaping Use  . Vaping Use: Never used  Substance and Sexual Activity  . Alcohol use: Yes    Comment: occasional  . Drug use: No  . Sexual  activity: Not on file  Other Topics Concern  . 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    One dog      Social Determinants of Health   Financial Resource Strain:   . Difficulty of Paying Living Expenses: Not on file  Food Insecurity:   . Worried About Programme researcher, broadcasting/film/video in the Last Year: Not on file  . Ran Out of Food in the Last Year: Not on file  Transportation Needs:   . Lack of Transportation (Medical): Not on file  . Lack of Transportation (Non-Medical): Not on file  Physical Activity:   . Days of Exercise per Week: Not on file  . Minutes of Exercise per Session: Not on file  Stress:   . Feeling of Stress : Not on file  Social Connections:   . Frequency of Communication with Friends and Family: Not on file  . Frequency of Social Gatherings with Friends and Family: Not on file  . Attends Religious Services: Not on file  . Active Member of Clubs or Organizations: Not on file  . Attends Banker Meetings: Not on file  . Marital Status: Not on file  Intimate Partner Violence:   . Fear of Current  or Ex-Partner: Not on file  . Emotionally Abused: Not on file  . Physically Abused: Not on file  . Sexually Abused: Not on file    Past Surgical History:  Procedure Laterality Date  . CARDIAC ELECTROPHYSIOLOGY STUDY AND ABLATION  12/ 2016    dr Onalee Hua fitzgerald  @[email or web address]   . COLONOSCOPY  last one 2014 approx.  MASS EXCISION N/A 08/12/2018   Procedure: EXCISION OF ANAL SKINTAG;  Surgeon: 14/08/2018, MD;  Location: [facility];  Service: General;  Laterality: N/A;    Family History  Problem Relation Age of Onset  . Cancer Mother        ovarian  . Diabetes Mother        type II  . Alcohol abuse Father   . Hypertension Father   . Hypertension Sister   . Cancer Paternal Grandmother        breast  . Heart disease Paternal  Grandmother   . Alcohol abuse Paternal Grandfather   . Cancer Paternal Aunt        breast  . Heart murmur Sister   . Osteoarthritis Sister     Allergies  Allergen Reactions  . Latex Hives    Current Outpatient Medications on File Prior to Visit  Medication Sig Dispense Refill  . Calcium Carbonate-Vitamin D (CALTRATE 600+D) 600-400 MG-UNIT tablet Take 1 tablet by mouth 2 (two) times daily.    . Multiple Vitamin (MULTIVITAMIN PO) Take by mouth daily.     . Nutritional Supplements (SALMON OIL) CAPS Take 1 capsule by mouth daily.    . traMADol (ULTRAM) 50 MG tablet Take 1 tablet (50 mg total) by mouth every 6 (six) hours as needed. 10 tablet 0   No current facility-administered medications on file prior to visit.    BP (!) 130/97 (BP Location: Left Arm, Patient Position: Sitting, Cuff Size: Small)   Pulse (!) 58   Temp 98.4 F (36.9 C) (Oral)   Resp 16   Ht 5\' 8"  (1.727 m)   Wt 150 lb (68 kg)   SpO2 100%   BMI 22.81 kg/m    Objective:   Physical Exam  Physical Exam  Constitutional: She is oriented to person, place, and time. She appears well-developed and well-nourished. No distress.  HENT:  Head: Normocephalic and atraumatic.  Right Ear: Tympanic membrane and ear canal normal.  Left Ear: Tympanic membrane and ear canal normal.  Mouth/Throat: not examined Eyes: Pupils are equal, round, and reactive to light. No scleral icterus.  Neck: Normal range of motion. No thyromegaly present.  Cardiovascular: Normal rate and regular rhythm.   No murmur heard. Pulmonary/Chest: Effort normal and breath sounds normal. No respiratory distress. He has no wheezes. She has no rales. She exhibits no tenderness.  Abdominal: Soft. Bowel sounds are normal. She exhibits no distension and no mass. There is no tenderness. There is no rebound and no guarding.  Musculoskeletal: She exhibits no edema.  Lymphadenopathy:    She has no cervical adenopathy.  Neurological: She is alert and oriented  to person, place, and time. She has normal patellar reflexes. She exhibits normal muscle tone. Coordination normal.  Skin: Skin is warm and dry.  Psychiatric: She has a normal mood and affect. Her behavior is normal. Judgment and thought content normal.  Breasts: Examined lying Right: Without masses, retractions, discharge or axillary adenopathy.  Left: Without masses, retractions, discharge or axillary adenopathy.          Assessment & Plan:  Preventative care- encouraged pt to continue healthy diet and regular exercise.  Obtain routine labs (pt is agreeable to pay out of pocket if insurance does not cover).  Refer for mammo and dexa. Pap and colo up to date. Patient declines flu shot and covid vaccine. I counseled pt on the benefits of the covid vaccine.   PAF- she has not seen cardiology since 2018. She would like to establish with someone closer to home. [facility]   This visit occurred during the SARS-CoV-2 public health emergency.  Safety protocols were in place, including screening questions prior to the visit, additional usage of staff PPE, and extensive cleaning of exam room while observing appropriate contact time as indicated for disinfecting solutions.          Assessment & Plan:

## 2020-07-07 LAB — CBC WITH DIFFERENTIAL/PLATELET
Absolute Monocytes: 531 cells/uL (ref 200–950)
Basophils Absolute: 41 cells/uL (ref 0–200)
Basophils Relative: 0.9 %
Eosinophils Absolute: 81 cells/uL (ref 15–500)
Eosinophils Relative: 1.8 %
HCT: 40.3 % (ref 35.0–45.0)
Hemoglobin: 13.3 g/dL (ref 11.7–15.5)
Lymphs Abs: 1584 cells/uL (ref 850–3900)
MCH: 29.6 pg (ref 27.0–33.0)
MCHC: 33 g/dL (ref 32.0–36.0)
MCV: 89.8 fL (ref 80.0–100.0)
MPV: 10.3 fL (ref 7.5–12.5)
Monocytes Relative: 11.8 %
Neutrophils Relative %: 50.3 %
Platelets: 258 10*3/uL (ref 140–400)
RBC: 4.49 10*6/uL (ref 3.80–5.10)
RDW: 12 % (ref 11.0–15.0)
Total Lymphocyte: 35.2 %
WBC: 4.5 10*3/uL (ref 3.8–10.8)

## 2020-07-07 LAB — BASIC METABOLIC PANEL
BUN: 23 mg/dL (ref 7–25)
CO2: 28 mmol/L (ref 20–32)
Calcium: 9.4 mg/dL (ref 8.6–10.4)
Chloride: 105 mmol/L (ref 98–110)
Creat: 0.75 mg/dL (ref 0.50–0.99)
Glucose, Bld: 82 mg/dL (ref 65–99)
Potassium: 4.2 mmol/L (ref 3.5–5.3)
Sodium: 140 mmol/L (ref 135–146)

## 2020-07-07 LAB — LIPID PANEL
Cholesterol: 180 mg/dL (ref <200)
HDL: 48 mg/dL — ABNORMAL LOW (ref >=50)
LDL Cholesterol (Calc): 115 mg/dL (calc) — ABNORMAL HIGH
Non-HDL Cholesterol (Calc): 132 mg/dL (calc) — ABNORMAL HIGH (ref <130)
Total CHOL/HDL Ratio: 3.8 (calc) (ref <5.0)
Triglycerides: 80 mg/dL (ref <150)

## 2020-07-07 LAB — HEPATIC FUNCTION PANEL
AG Ratio: 1.5 (calc) (ref 1.0–2.5)
ALT: 17 U/L (ref 6–29)
AST: 21 U/L (ref 10–35)
Albumin: 4.3 g/dL (ref 3.6–5.1)
Alkaline phosphatase (APISO): 71 U/L (ref 37–153)
Globulin: 2.9 g/dL (calc) (ref 1.9–3.7)
Indirect Bilirubin: 0.5 mg/dL (calc) (ref 0.2–1.2)
Total Bilirubin: 0.7 mg/dL (ref 0.2–1.2)
Total Protein: 7.2 g/dL (ref 6.1–8.1)

## 2020-07-07 LAB — TSH: TSH: 3.07 mIU/L (ref 0.40–4.50)

## 2020-07-10 ENCOUNTER — Ambulatory Visit (HOSPITAL_BASED_OUTPATIENT_CLINIC_OR_DEPARTMENT_OTHER)
Admission: RE | Admit: 2020-07-10 | Discharge: 2020-07-10 | Disposition: A | Discharge status: Home / Self Care | Payer: BC Managed Care – PPO | Source: Ambulatory Visit | Attending: Family | Admitting: Family

## 2020-07-10 ENCOUNTER — Other Ambulatory Visit: Payer: Self-pay

## 2020-07-10 DIAGNOSIS — E2839 Other primary ovarian failure: Secondary | ICD-10-CM | POA: Diagnosis not present

## 2020-07-10 DIAGNOSIS — M8580 Other specified disorders of bone density and structure, unspecified site: Secondary | ICD-10-CM | POA: Insufficient documentation

## 2020-07-10 DIAGNOSIS — M85851 Other specified disorders of bone density and structure, right thigh: Secondary | ICD-10-CM | POA: Diagnosis not present

## 2020-07-10 DIAGNOSIS — Z780 Asymptomatic menopausal state: Secondary | ICD-10-CM | POA: Diagnosis not present

## 2020-07-10 DIAGNOSIS — E348 Other specified endocrine disorders: Secondary | ICD-10-CM

## 2020-07-19 ENCOUNTER — Other Ambulatory Visit (HOSPITAL_BASED_OUTPATIENT_CLINIC_OR_DEPARTMENT_OTHER): Payer: BC Managed Care – PPO

## 2020-08-01 DIAGNOSIS — I480 Paroxysmal atrial fibrillation: Secondary | ICD-10-CM | POA: Insufficient documentation

## 2020-08-01 DIAGNOSIS — K644 Residual hemorrhoidal skin tags: Secondary | ICD-10-CM | POA: Insufficient documentation

## 2020-08-01 DIAGNOSIS — Z973 Presence of spectacles and contact lenses: Secondary | ICD-10-CM | POA: Insufficient documentation

## 2020-08-01 DIAGNOSIS — R0989 Other specified symptoms and signs involving the circulatory and respiratory systems: Secondary | ICD-10-CM | POA: Insufficient documentation

## 2020-08-07 NOTE — Progress Notes (Signed)
Cardiology Office Note:    Date:  08/08/2020   ID:  Julia Carr, DOB Oct 12, 1955, MRN [id number]  PCP:  Sandford Craze, NP  Cardiologist:  Norman Herrlich, MD   Referring MD: Sandford Craze, NP  ASSESSMENT:    1. PAF (paroxysmal atrial fibrillation) (HCC)   2. Typical atrial flutter (HCC)   3. Labile hypertension    PLAN:    In order of problems listed above:  1. She is done exceptionally well after atrial fibrillation a flutter ablation being young structurally normal heart I think she will get a durable good long-term result however recurrence rates are somewhere in the range of 25 to 35% at 5 years.  I encouraged her to screen her self at home using the iPhone adapter several times a week sign up for my chart and transmit any strips that are concerned.  She will avoid over-the-counter proarrhythmic drugs continue her good lifestyle and I do not think there is any indication at this time to consider antiplatelet anticoagulant beta-blocker and antiarrhythmic drug. 2. BP is ideal  Next appointment 1 year   Medication Adjustments/Labs and Tests Ordered: Current medicines are reviewed at length with the patient today.  Concerns regarding medicines are outlined above.  No orders of the defined types were placed in this encounter.  No orders of the defined types were placed in this encounter.    Chief Complaint  Patient presents with  . Follow-up    Has had EP intervention for atrial fibrillation and flutter in 2016 and is here today to reestablish cardiology care    History of Present Illness:    Julia Carr is a 64 y.o. female who is being seen today for the evaluation of atrial fibrillation at the request of Sandford Craze, NP.  Chart review shows that she had seen Dr. Clydie Braun, EP [facility] 08/04/2017 with a history of cryoballoon ablation December 2016 with associated radiofrequency ablation of right atrial isthmus for atrial flutter.   At that time shows off antiarrhythmic drugs her history also included hypertension.  A 7 day event monitor report in July 2017 showed sinus rhythm no recurrence of atrial fibrillation.  She has done exceptionally well after ablation has had no clinical recurrence very rarely and she can only think of when she gets brief palpitation. She follows good lifestyle does not snore very little alcohol avoids over-the-counter proarrhythmic drugs and exercises running 3 days a week. Recent labs reviewed thyroid studies are normal her lipid profile is favorable Otherwise no palpitations syncope shortness of breath chest pain or edema. In the past she thinks that stress was a great precipitant for arrhythmia and she is addressed it.  Past Medical History:  Diagnosis Date  . Anal skin tag   . Atrial fibrillation (HCC) 07/13/2014  . Essential hypertension 01/03/2015  . H/O prior ablation treatment 08/03/2017   Formatting of this note might be different from the original. Cryo-balloon ablation of atrial fibrillation and radiofrequency ablation of atrial flutter December 2016  . Hemorrhoids   . History of pneumothorax    treated with chest tube  . Labile hypertension    followed by cardiology (dr fitzgerald)  . Loss of weight 07/13/2014  . Neck pain 01/03/2015  . Osteopenia 09/13/2014  . PAF (paroxysmal atrial fibrillation) [facility])    cardiologist-  dr Onalee Hua fitzgerald [facility] in [facility])---  08/2015 s/p cryo balloon ablation for fib/flutter   . Preventative health care 08/04/2014  . Wears contact lenses  Past Surgical History:  Procedure Laterality Date  . CARDIAC ELECTROPHYSIOLOGY STUDY AND ABLATION  12/ 2016    dr Onalee Hua fitzgerald  @[email or web address]   . COLONOSCOPY  last one 2014 approx.  MASS EXCISION N/A 08/12/2018   Procedure: EXCISION OF ANAL SKINTAG;  Surgeon: 14/08/2018, MD;  Location: [facility];  Service: General;  Laterality: N/A;    Current Medications: Current Meds   Medication Sig  . Calcium Carbonate-Vitamin D (CALTRATE 600+D) 600-400 MG-UNIT tablet Take 1 tablet by mouth 2 (two) times daily.  . Multiple Vitamin (MULTIVITAMIN PO) Take by mouth daily.   . Nutritional Supplements (SALMON OIL) CAPS Take 1 capsule by mouth daily.     Allergies:   Latex   Social History   Socioeconomic History  . Marital status: Married    Spouse name: Not on file  . Number of children: Not on file  . Years of education: Not on file  . Highest education level: Not on file  Occupational History  . Not on file  Tobacco Use  . Smoking status: Former Smoker    Packs/day: 0.50    Years: 25.00    Pack years: 12.50    Types: Cigarettes    Quit date: 08/06/2008    Years since quitting: 12.0  . Smokeless tobacco: Never Used  Vaping Use  . Vaping Use: Never used  Substance and Sexual Activity  . Alcohol use: Yes    Comment: occasional  . Drug use: No  . Sexual activity: Not on file  Other Topics Concern  . Not on file  Social History Narrative   Daughter born 60   Married- works for [employer] as a [employer], horseback riding, hiking.    One dog      Social Determinants of Health   Financial Resource Strain:   . Difficulty of Paying Living Expenses: Not on file  Food Insecurity:   . Worried About Paediatric nurse in the Last Year: Not on file  . Ran Out of Food in the Last Year: Not on file  Transportation Needs:   . Lack of Transportation (Medical): Not on file  . Lack of Transportation (Non-Medical): Not on file  Physical Activity:   . Days of Exercise per Week: Not on file  . Minutes of Exercise per Session: Not on file  Stress:   . Feeling of Stress : Not on file  Social Connections:   . Frequency of Communication with Friends and Family: Not on file  . Frequency of Social Gatherings with Friends and Family: Not on file  . Attends Religious Services: Not on file  . Active Member of Clubs or Organizations: Not on  file  . Attends Programme researcher, broadcasting/film/video Meetings: Not on file  . Marital Status: Not on file     Family History: The patient's family history includes Alcohol abuse in her father and paternal grandfather; Cancer in her mother, paternal aunt, and paternal grandmother; Diabetes in her mother; Heart disease in her paternal grandmother; Heart murmur in her sister; Hypertension in her father and sister; Osteoarthritis in her sister.  ROS:   ROS Please see the history of present illness.     All other systems reviewed and are negative.  EKGs/Labs/Other Studies Reviewed:    The following studies were reviewed today:   EKG:  EKG is  ordered today.  The ekg ordered today is personally reviewed and demonstrates sinus rhythm and is normal  Recent  Labs: 07/06/2020: ALT 17; BUN 23; Creat 0.75; Hemoglobin 13.3; Platelets 258; Potassium 4.2; Sodium 140; TSH 3.07  Recent Lipid Panel    Component Value Date/Time   CHOL 180 07/06/2020 0743   TRIG 80 07/06/2020 0743   HDL 48 (L) 07/06/2020 0743   CHOLHDL 3.8 07/06/2020 0743   VLDL 16.8 07/15/2019 0730   LDLCALC 115 (H) 07/06/2020 0743    Physical Exam:    VS:  BP 134/88   Pulse 69   Ht 5\' 8"  (1.727 m)   Wt 149 lb (67.6 kg)   SpO2 99%   BMI 22.66 kg/m     Wt Readings from Last 3 Encounters:  08/08/20 149 lb (67.6 kg)  07/06/20 150 lb (68 kg)  07/15/19 147 lb (66.7 kg)    Repeat blood pressure by me at rest 122/80 GEN: Appears young and healthy well nourished, well developed in no acute distress HEENT: Normal NECK: No JVD; No carotid bruits LYMPHATICS: No lymphadenopathy CARDIAC: RRR, no murmurs, rubs, gallops RESPIRATORY:  Clear to auscultation without rales, wheezing or rhonchi  ABDOMEN: Soft, non-tender, non-distended MUSCULOSKELETAL:  No edema; No deformity  SKIN: Warm and dry NEUROLOGIC:  Alert and oriented x 3 PSYCHIATRIC:  Normal affect     Signed, 07/17/19, MD  08/08/2020 9:11 AM    [facility]

## 2020-08-08 ENCOUNTER — Encounter: Payer: Self-pay | Admitting: Cardiology

## 2020-08-08 ENCOUNTER — Other Ambulatory Visit: Payer: Self-pay

## 2020-08-08 ENCOUNTER — Ambulatory Visit: Payer: BC Managed Care – PPO | Admitting: Cardiology

## 2020-08-08 VITALS — BP 134/88 | HR 69 | Ht 68.0 in | Wt 149.0 lb

## 2020-08-08 DIAGNOSIS — I483 Typical atrial flutter: Secondary | ICD-10-CM | POA: Diagnosis not present

## 2020-08-08 DIAGNOSIS — I480 Paroxysmal atrial fibrillation: Secondary | ICD-10-CM | POA: Diagnosis not present

## 2020-08-08 DIAGNOSIS — R0989 Other specified symptoms and signs involving the circulatory and respiratory systems: Secondary | ICD-10-CM

## 2020-08-08 NOTE — Patient Instructions (Addendum)
Medication Instructions:  Your physician recommends that you continue on your current medications as directed. Please refer to the Current Medication list given to you today.  *If you need a refill on your cardiac medications before your next appointment, please call your pharmacy*   Lab Work: None If you have labs (blood work) drawn today and your tests are completely normal, you will receive your results only by: Marland Kitchen MyChart Message (if you have MyChart) OR . A paper copy in the mail If you have any lab test that is abnormal or we need to change your treatment, we will call you to review the results.   Testing/Procedures: None   Follow-Up: At [facility], you and your health needs are our priority.  As part of our continuing mission to provide you with exceptional heart care, we have created designated Provider Care Teams.  These Care Teams include your primary Cardiologist (physician) and Advanced Practice Providers (APPs -  Physician Assistants and Nurse Practitioners) who all work together to provide you with the care you need, when you need it.  We recommend signing up for the patient portal called "MyChart".  Sign up information is provided on this After Visit Summary.  MyChart is used to connect with patients for Virtual Visits (Telemedicine).  Patients are able to view lab/test results, encounter notes, upcoming appointments, etc.  Non-urgent messages can be sent to your provider as well.   To learn more about what you can do with MyChart, go to [email or web address].    Your next appointment:   1 year(s)  The format for your next appointment:   In Person  Provider:   Norman Herrlich, MD   Other Instructions 1. Avoid all over-the-counter antihistamines except Claritin/Loratadine and Zyrtec/Cetrizine. 2. Avoid all combination including cold sinus allergies flu decongestant and sleep medications 3. You can use Robitussin DM Mucinex and Mucinex DM for cough. 4. can use  Tylenol aspirin ibuprofen and naproxen but no combinations such as sleep or sinus.  KardiaMobile [email or web address]        FDA-cleared, clinical grade mobile EKG monitor: Lourena Simmonds is the most clinically-validated mobile EKG used by the world's leading cardiac care medical professionals With Basic service, know instantly if your heart rhythm is normal or if atrial fibrillation is detected, and email the last single EKG recording to yourself or your doctor Premium service, available for purchase through the Kardia app for $9.99 per month or $99 per year, includes unlimited history and storage of your EKG recordings, a monthly EKG summary report to share with your doctor, along with the ability to track your blood pressure, activity and weight Includes one KardiaMobile phone clip FREE SHIPPING: Standard delivery 1-3 business days. Orders placed by 11:00am PST will ship that afternoon. Otherwise, will ship next business day. All orders ship via [employer] from [place], [place]

## 2020-08-15 ENCOUNTER — Other Ambulatory Visit: Payer: Self-pay

## 2020-08-15 ENCOUNTER — Ambulatory Visit
Admission: RE | Admit: 2020-08-15 | Discharge: 2020-08-15 | Disposition: A | Discharge status: Home / Self Care | Payer: BC Managed Care – PPO | Source: Ambulatory Visit | Attending: Family | Admitting: Family

## 2020-08-15 DIAGNOSIS — Z0000 Encounter for general adult medical examination without abnormal findings: Secondary | ICD-10-CM

## 2020-08-15 DIAGNOSIS — Z1231 Encounter for screening mammogram for malignant neoplasm of breast: Secondary | ICD-10-CM | POA: Diagnosis not present

## 2021-03-05 ENCOUNTER — Encounter: Payer: Self-pay | Admitting: Family

## 2021-04-02 ENCOUNTER — Ambulatory Visit: Payer: BC Managed Care – PPO | Admitting: Cardiology

## 2021-05-02 NOTE — Progress Notes (Signed)
Cardiology Office Note:    Date:  05/03/2021   ID:  Julia Carr, DOB [birth year], MRN [id number]  PCP:  Sandford Craze, NP  Cardiologist:  Norman Herrlich, MD    Referring MD: Sandford Craze, NP    ASSESSMENT:    1. PAF (paroxysmal atrial fibrillation) (HCC)   2. Typical atrial flutter (HCC)   3. Essential hypertension    PLAN:    In order of problems listed above:  She continues to do well after EP ablation atrial fibrillation flutter no clinical recurrence not anticoagulated with a CHADS2 score of 1 Encouraged her to purchase the cardia mobile device for screening heart rhythm at home Controlled with lifestyle  modification   Next appointment: 1 day appearance   Medication Adjustments/Labs and Tests Ordered: Current medicines are reviewed at length with the patient today.  Concerns regarding medicines are outlined above.  No orders of the defined types were placed in this encounter.  No orders of the defined types were placed in this encounter.   Chief Complaint  Patient presents with   Follow-up   Atrial Fibrillation    History of Present Illness:    Julia Carr is a 65 y.o. female with a hx of atrial fibrillation atrial flutter and hypertension with a history of cryoablation December 2016 and radiofrequency ablation of the right atrial isthmus for atrial flutter.  She was last seen 08/08/2020 maintaining sinus rhythm and no longer anticoagulated with a CHADS2 score of 1.  Compliance with diet, lifestyle and medications: She continues to do well presently taking no cardiac medications She runs 10 to 12 miles a week no exercise intolerance shortness of breath chest pain edema syncope. She has occasional palpitation not severe or sustained She has a smart watch and she has had no signals for arrhythmia Recent labs 07/06/2020 cholesterol 180 LDL 115 triglycerides 80 HDL 48 hemoglobin 13.3 Past Medical History:  Diagnosis Date   Anal skin tag     Atrial fibrillation (HCC) 07/13/2014   Essential hypertension 01/03/2015   H/O prior ablation treatment 08/03/2017   Formatting of this note might be different from the original. Cryo-balloon ablation of atrial fibrillation and radiofrequency ablation of atrial flutter December 2016   Hemorrhoids    History of pneumothorax    treated with chest tube   Labile hypertension    followed by cardiology (dr fitzgerald)   Loss of weight 07/13/2014   Neck pain 01/03/2015   Osteopenia 09/13/2014   PAF (paroxysmal atrial fibrillation) [facility])    cardiologist-  dr Onalee Hua fitzgerald [facility] in [facility])---  08/2015 s/p cryo balloon ablation for fib/flutter    Preventative health care 08/04/2014   Wears contact lenses     Past Surgical History:  Procedure Laterality Date   CARDIAC ELECTROPHYSIOLOGY STUDY AND ABLATION  12/ 2016    dr Onalee Hua fitzgerald  @[facility]    COLONOSCOPY  last one 2014 approx.   MASS EXCISION N/A 08/12/2018   Procedure: EXCISION OF ANAL SKINTAG;  Surgeon: 14/08/2018, MD;  Location: [facility];  Service: General;  Laterality: N/A;    Current Medications: Current Meds  Medication Sig   Calcium Carbonate-Vitamin D (CALTRATE 600+D) 600-400 MG-UNIT tablet Take 1 tablet by mouth 2 (two) times daily.   Multiple Vitamin (MULTIVITAMIN PO) Take by mouth daily.      Allergies:   Latex   Social History   Socioeconomic History   Marital status: Married    Spouse name: Not on file  Number of children: Not on file   Years of education: Not on file   Highest education level: Not on file  Occupational History   Not on file  Tobacco Use   Smoking status: Former    Packs/day: 0.50    Years: 25.00    Pack years: 12.50    Types: Cigarettes    Quit date: 08/06/2008    Years since quitting: 12.7   Smokeless tobacco: Never  Vaping Use   Vaping Use: Never used  Substance and Sexual Activity   Alcohol use: Yes    Comment: occasional   Drug use: No   Sexual  activity: Not on file  Other Topics Concern   Not on file  Social History Narrative   Daughter born 78   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    One dog      Social Determinants of Corporate investment banker Strain: Not on file  Food Insecurity: Not on file  Transportation Needs: Not on file  Physical Activity: Not on file  Stress: Not on file  Social Connections: Not on file     Family History: The patient's family history includes Alcohol abuse in her father and paternal grandfather; Cancer in her mother, paternal aunt, and paternal grandmother; Diabetes in her mother; Heart disease in her paternal grandmother; Heart murmur in her sister; Hypertension in her father and sister; Osteoarthritis in her sister. ROS:   Please see the history of present illness.    All other systems reviewed and are negative.  EKGs/Labs/Other Studies Reviewed:    The following studies were reviewed today:    Recent Labs: 07/06/2020: ALT 17; BUN 23; Creat 0.75; Hemoglobin 13.3; Platelets 258; Potassium 4.2; Sodium 140; TSH 3.07  Recent Lipid Panel    Component Value Date/Time   CHOL 180 07/06/2020 0743   TRIG 80 07/06/2020 0743   HDL 48 (L) 07/06/2020 0743   CHOLHDL 3.8 07/06/2020 0743   VLDL 16.8 07/15/2019 0730   LDLCALC 115 (H) 07/06/2020 0743    Physical Exam:    VS:  BP 126/74 (BP Location: Right Arm, Patient Position: Sitting, Cuff Size: Normal)   Pulse 62   Ht 5\' 8"  (1.727 m)   Wt 149 lb 1.3 oz (67.6 kg)   SpO2 99%   BMI 22.67 kg/m     Wt Readings from Last 3 Encounters:  05/03/21 149 lb 1.3 oz (67.6 kg)  08/08/20 149 lb (67.6 kg)  07/06/20 150 lb (68 kg)     GEN:  Well nourished, well developed in no acute distress HEENT: Normal NECK: No JVD; No carotid bruits LYMPHATICS: No lymphadenopathy CARDIAC: RRR, no murmurs, rubs, gallops RESPIRATORY:  Clear to auscultation without rales, wheezing or rhonchi   ABDOMEN: Soft, non-tender, non-distended MUSCULOSKELETAL:  No edema; No deformity  SKIN: Warm and dry NEUROLOGIC:  Alert and oriented x 3 PSYCHIATRIC:  Normal affect    Signed, 13/05/21, MD  05/03/2021 8:22 AM    [facility]

## 2021-05-03 ENCOUNTER — Ambulatory Visit: Payer: BC Managed Care – PPO | Admitting: Cardiology

## 2021-05-03 ENCOUNTER — Other Ambulatory Visit: Payer: Self-pay

## 2021-05-03 ENCOUNTER — Encounter: Payer: Self-pay | Admitting: Cardiology

## 2021-05-03 VITALS — BP 126/74 | HR 62 | Ht 68.0 in | Wt 149.1 lb

## 2021-05-03 DIAGNOSIS — I483 Typical atrial flutter: Secondary | ICD-10-CM | POA: Diagnosis not present

## 2021-05-03 DIAGNOSIS — I10 Essential (primary) hypertension: Secondary | ICD-10-CM

## 2021-05-03 DIAGNOSIS — I480 Paroxysmal atrial fibrillation: Secondary | ICD-10-CM | POA: Diagnosis not present

## 2021-05-03 NOTE — Patient Instructions (Signed)
Medication Instructions:  No medication changes. *If you need a refill on your cardiac medications before your next appointment, please call your pharmacy*   Lab Work: None ordered If you have labs (blood work) drawn today and your tests are completely normal, you will receive your results only by: MyChart Message (if you have MyChart) OR A paper copy in the mail If you have any lab test that is abnormal or we need to change your treatment, we will call you to review the results.   Testing/Procedures: None ordered   Follow-Up: At [facility], you and your health needs are our priority.  As part of our continuing mission to provide you with exceptional heart care, we have created designated Provider Care Teams.  These Care Teams include your primary Cardiologist (physician) and Advanced Practice Providers (APPs -  Physician Assistants and Nurse Practitioners) who all work together to provide you with the care you need, when you need it.  We recommend signing up for the patient portal called "MyChart".  Sign up information is provided on this After Visit Summary.  MyChart is used to connect with patients for Virtual Visits (Telemedicine).  Patients are able to view lab/test results, encounter notes, upcoming appointments, etc.  Non-urgent messages can be sent to your provider as well.   To learn more about what you can do with MyChart, go to [email or web address].    Your next appointment:   18 month(s)  The format for your next appointment:   In Person  Provider:   Norman Herrlich, MD   Other Instructions  FDA-cleared personal EKG: The world's most clinically validated personal EKG, FDA-cleared to detect Atrial Fibrillation, Bradycardia, and Tachycardia. Mayford Knife is the most reliable way to check in on your heart from home. Take your EKG from anywhere: Capture a medical-grade EKG in 30 seconds and get an instant analysis right on your smartphone. Mayford Knife is small enough  to fit in your pocket, so you can take it with you anywhere. Easy to use: Simply place your fingers on the sensors--no wires, patches, or gels. Recommended by doctors: A trusted resource, Mayford Knife is the #1 doctor-recommended personal EKG with more than 100 million EKGs recorded. Save or share your EKGs: With the press of a button, email your EKGs to your doctor or save them on your phone. Works with smartphones: Compatible with Event organiser and tablets. Check our compatibility chart. FSA/HSA eligible: Purchase using an FSA or HSA account (please confirm coverage with your insurance provider). Phone clip included with purchase, a $15 value. Conveniently take your device with you wherever you go.  [email or web address]

## 2021-06-07 ENCOUNTER — Other Ambulatory Visit: Payer: Self-pay

## 2021-06-07 ENCOUNTER — Ambulatory Visit (INDEPENDENT_AMBULATORY_CARE_PROVIDER_SITE_OTHER): Payer: BC Managed Care – PPO | Admitting: Family

## 2021-06-07 ENCOUNTER — Encounter: Payer: Self-pay | Admitting: Family

## 2021-06-07 ENCOUNTER — Other Ambulatory Visit (HOSPITAL_COMMUNITY)
Admission: RE | Admit: 2021-06-07 | Discharge: 2021-06-07 | Disposition: A | Discharge status: Home / Self Care | Payer: BC Managed Care – PPO | Source: Ambulatory Visit | Attending: Family | Admitting: Family

## 2021-06-07 VITALS — BP 125/80 | HR 64 | Temp 98.5°F | Resp 16 | Ht 68.0 in | Wt 149.0 lb

## 2021-06-07 DIAGNOSIS — Z23 Encounter for immunization: Secondary | ICD-10-CM

## 2021-06-07 DIAGNOSIS — Z01419 Encounter for gynecological examination (general) (routine) without abnormal findings: Secondary | ICD-10-CM | POA: Diagnosis not present

## 2021-06-07 DIAGNOSIS — Z0000 Encounter for general adult medical examination without abnormal findings: Secondary | ICD-10-CM | POA: Diagnosis not present

## 2021-06-07 LAB — COMPREHENSIVE METABOLIC PANEL
ALT: 22 U/L (ref 0–35)
AST: 24 U/L (ref 0–37)
Albumin: 4.1 g/dL (ref 3.5–5.2)
Alkaline Phosphatase: 76 U/L (ref 39–117)
BUN: 19 mg/dL (ref 6–23)
CO2: 31 mEq/L (ref 19–32)
Calcium: 9.1 mg/dL (ref 8.4–10.5)
Chloride: 104 mEq/L (ref 96–112)
Creatinine, Ser: 0.81 mg/dL (ref 0.40–1.20)
GFR: 77.31 mL/min (ref >60.00)
Glucose, Bld: 78 mg/dL (ref 70–99)
Potassium: 4 mEq/L (ref 3.5–5.1)
Sodium: 141 mEq/L (ref 135–145)
Total Bilirubin: 0.7 mg/dL (ref 0.2–1.2)
Total Protein: 7.1 g/dL (ref 6.0–8.3)

## 2021-06-07 LAB — CBC WITH DIFFERENTIAL/PLATELET
Basophils Absolute: 0 10*3/uL (ref 0.0–0.1)
Basophils Relative: 1 % (ref 0.0–3.0)
Eosinophils Absolute: 0.1 10*3/uL (ref 0.0–0.7)
Eosinophils Relative: 1.6 % (ref 0.0–5.0)
HCT: 40.2 % (ref 36.0–46.0)
Hemoglobin: 13.3 g/dL (ref 12.0–15.0)
Lymphocytes Relative: 36.3 % (ref 12.0–46.0)
Lymphs Abs: 1.7 10*3/uL (ref 0.7–4.0)
MCHC: 33.1 g/dL (ref 30.0–36.0)
MCV: 87.5 fl (ref 78.0–100.0)
Monocytes Absolute: 0.4 10*3/uL (ref 0.1–1.0)
Monocytes Relative: 9.7 % (ref 3.0–12.0)
Neutro Abs: 2.3 10*3/uL (ref 1.4–7.7)
Neutrophils Relative %: 51.4 % (ref 43.0–77.0)
Platelets: 256 10*3/uL (ref 150.0–400.0)
RBC: 4.59 Mil/uL (ref 3.87–5.11)
RDW: 13.1 % (ref 11.5–15.5)
WBC: 4.5 10*3/uL (ref 4.0–10.5)

## 2021-06-07 LAB — LIPID PANEL
Cholesterol: 180 mg/dL (ref 0–200)
HDL: 46.9 mg/dL (ref >39.00)
LDL Cholesterol: 112 mg/dL — ABNORMAL HIGH (ref 0–99)
NonHDL: 132.8
Total CHOL/HDL Ratio: 4
Triglycerides: 103 mg/dL (ref 0.0–149.0)
VLDL: 20.6 mg/dL (ref 0.0–40.0)

## 2021-06-07 LAB — TSH: TSH: 3.26 u[IU]/mL (ref 0.35–5.50)

## 2021-06-07 NOTE — Addendum Note (Signed)
Addended by: Wilford Corner on: 06/07/2021 07:50 AM   Modules accepted: Orders

## 2021-06-07 NOTE — Addendum Note (Signed)
Addended by: Wilford Corner on: 06/07/2021 10:00 AM   Modules accepted: Orders

## 2021-06-07 NOTE — Assessment & Plan Note (Signed)
Encouraged pt to continue healthy diet, exercise.  Declines flu shot/covid vaccine. She is undecided on the Shingrix vaccine. She will get tetanus another day. Mammogram will be due in December.  Pap performed today.  She is requesting a full panel of labs and understands that this may not be covered by her insurance.

## 2021-06-07 NOTE — Progress Notes (Addendum)
Subjective:   By signing my name below, I, Lyric Barr-McArthur, attest that this documentation has been prepared under the direction and in the presence of Debbrah Alar, NP, 06/07/2021   Patient ID: Julia Carr, female    DOB: 1955-12-17, 65 y.o.   MRN: [id number]  Chief Complaint  Patient presents with   Annual Exam    HPI  Patient is in today for a comprehensive physical exam.   She denies any fever, unexpected weight change, adenopathy, rash, hearing loss, ear pain, rhinorrhea, visual disturbances, eye pain, chest pain, leg swelling, cough, nausea, vomitting, diarrhea, blood in stool, dysuria, frequency, myalgias, arthralgias, headaches, depression or anxiety.  Immunizations: She is due for tetanus in January and will come back at a later date. She does not want to receive her flu shot. She does not want to receive a Covid-19 vaccine. She is debating wether she would like the Shingrix vaccine.  Medications: She takes a 400 mg calcium supplement and a daily multivitamin. HIV and hepatitis C screening: She is not interested in receiving a one time HIV and hepatitis C screening.  Diet: She maintains a healthy diet. She is currently participating in a gluten free diet.  Exercise: She is an avid runner and participates daily in regular exercise.  Colonoscopy: She reported a colonoscopy in 2014. Repeat in 10 years.  Dexa: Last performed on 07/10/2020 and results showed the BMD measured at Femur Neck Right was 0.877 g/cm2 with a T-score of -1.2. She is osteopenic. Repeat scan in 2 years.  Pap Smear: Last performed on 07/23/2018 and results were normal. Repeat every 3 years.  Mammogram: Last performed on 08/15/2020 and results were normal. Repeat in one year. She is going to speak to her work about receiving her next screening with them.  Shx: No new surgeries reported in the last year.  FMHx: She reports no new changes in her family history. Alcohol: She reports minimal drinking  at 2 beers a week. Drugs: She does not use drugs.  Tobacco: She does not use any tobacco products. Sexual health: She has one female partner. Work: She currently still works at [employer] and is retiring in December 2022.   Health Maintenance Due  Topic Date Due   COVID-19 Vaccine (1) Never done   Zoster Vaccines- Shingrix (1 of 2) Never done   PAP SMEAR-Modifier  07/23/2021    Past Medical History:  Diagnosis Date   Anal skin tag    Atrial fibrillation ([place]) 07/13/2014   Essential hypertension 01/03/2015   H/O prior ablation treatment 08/03/2017   Formatting of this note might be different from the original. Cryo-balloon ablation of atrial fibrillation and radiofrequency ablation of atrial flutter December 2016   Hemorrhoids    History of pneumothorax    treated with chest tube   Labile hypertension    followed by cardiology (dr fitzgerald)   Loss of weight 07/13/2014   Neck pain 01/03/2015   Osteopenia 09/13/2014   PAF (paroxysmal atrial fibrillation) [facility])    cardiologist-  dr Shanon Brow fitzgerald [facility] in [facility])---  08/2015 s/p cryo balloon ablation for fib/flutter    Preventative health care 08/04/2014   Wears contact lenses     Past Surgical History:  Procedure Laterality Date   CARDIAC ELECTROPHYSIOLOGY STUDY AND ABLATION  12/ 2016    dr Shanon Brow fitzgerald  @[facility]    COLONOSCOPY  last one 2014 approx.   MASS EXCISION N/A 08/12/2018   Procedure: EXCISION OF ANAL SKINTAG;  Surgeon:  Leighton Ruff, MD;  Location: [facility];  Service: General;  Laterality: N/A;    Family History  Problem Relation Age of Onset   Cancer Mother        ovarian   Diabetes Mother        type II   Alcohol abuse Father    Hypertension Father    Hypertension Sister    Cancer Paternal Grandmother        breast   Heart disease Paternal Grandmother    Alcohol abuse Paternal Grandfather    Cancer Paternal Aunt        breast   Heart murmur Sister    Osteoarthritis Sister      Social History   Socioeconomic History   Marital status: Married    Spouse name: Not on file   Number of children: Not on file   Years of education: Not on file   Highest education level: Not on file  Occupational History   Not on file  Tobacco Use   Smoking status: Former    Packs/day: 0.50    Years: 25.00    Pack years: 12.50    Types: Cigarettes    Quit date: 08/06/2008    Years since quitting: 12.8   Smokeless tobacco: Never  Vaping Use   Vaping Use: Never used  Substance and Sexual Activity   Alcohol use: Yes    Comment: occasional   Drug use: No   Sexual activity: Yes    Partners: Male  Other Topics Concern   Not on file  Social History Narrative   Daughter born 74   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    One dog      Social Determinants of Radio broadcast assistant Strain: Not on file  Food Insecurity: Not on file  Transportation Needs: Not on file  Physical Activity: Not on file  Stress: Not on file  Social Connections: Not on file  Intimate Partner Violence: Not on file    Outpatient Medications Prior to Visit  Medication Sig Dispense Refill   Calcium Carbonate-Vitamin D (CALTRATE 600+D) 600-400 MG-UNIT tablet Take 1 tablet by mouth 2 (two) times daily.     Multiple Vitamin (MULTIVITAMIN PO) Take by mouth daily.      No facility-administered medications prior to visit.    Allergies  Allergen Reactions   Latex Hives    Review of Systems  Constitutional:  Negative for fever.       (-) unexpected weight changes (-) adenopathy   HENT:  Negative for ear pain and hearing loss.        (-) rhinorrhea  Eyes:  Negative for pain.       (-) visual disturbances  Respiratory:  Negative for cough.   Cardiovascular:  Negative for chest pain and leg swelling.  Gastrointestinal:  Negative for blood in stool, diarrhea, nausea and vomiting.  Genitourinary:  Negative for dysuria and  frequency.  Musculoskeletal:  Negative for joint pain and myalgias.  Skin:  Negative for rash.  Neurological:  Negative for headaches.  Psychiatric/Behavioral:  Negative for depression. The patient is not nervous/anxious.       Objective:    Physical Exam Exam conducted with a chaperone present.  Constitutional:      General: She is not in acute distress.    Appearance: Normal appearance. She is not ill-appearing.  HENT:     Head: Normocephalic and atraumatic.  Right Ear: Tympanic membrane, ear canal and external ear normal.     Left Ear: Tympanic membrane, ear canal and external ear normal.  Eyes:     Extraocular Movements: Extraocular movements intact.     Pupils: Pupils are equal, round, and reactive to light.     Comments: (-) nystagmus   Cardiovascular:     Rate and Rhythm: Normal rate and regular rhythm.     Heart sounds: Normal heart sounds. No murmur heard.   No gallop.  Pulmonary:     Effort: Pulmonary effort is normal. No respiratory distress.     Breath sounds: Normal breath sounds. No wheezing or rales.  Chest:  Breasts:    Right: Normal. No mass.     Left: Normal. No mass.  Abdominal:     General: Bowel sounds are normal.     Tenderness: There is no abdominal tenderness.  Genitourinary:    General: Normal vulva.     Labia:        Right: No rash.        Left: No rash.      Vagina: Normal.     Cervix: Normal.     Uterus: Normal.      Adnexa: Right adnexa normal and left adnexa normal.  Musculoskeletal:     Comments: (+) 5/5 strength in upper and lower extremities   Lymphadenopathy:     Cervical: No cervical adenopathy.  Skin:    General: Skin is warm and dry.  Neurological:     Mental Status: She is alert and oriented to person, place, and time.     Deep Tendon Reflexes:     Reflex Scores:      Patellar reflexes are 2+ on the right side and 2+ on the left side. Psychiatric:        Behavior: Behavior normal.        Judgment: Judgment normal.     BP 125/80 (BP Location: Right Arm, Patient Position: Sitting, Cuff Size: Small)   Pulse 64   Temp 98.5 F (36.9 C) (Oral)   Resp 16   Ht 5' 8"  (1.727 m)   Wt 149 lb (67.6 kg)   SpO2 100%   BMI 22.66 kg/m  Wt Readings from Last 3 Encounters:  06/07/21 149 lb (67.6 kg)  05/03/21 149 lb 1.3 oz (67.6 kg)  08/08/20 149 lb (67.6 kg)       Assessment & Plan:   Problem List Items Addressed This Visit       Unprioritized   Preventative health care - Primary    Encouraged pt to continue healthy diet, exercise.  Declines flu shot/covid vaccine. She is undecided on the Shingrix vaccine. She will get tetanus another day. Mammogram will be due in December.  Pap performed today.  She is requesting a full panel of labs and understands that this may not be covered by her insurance.       Relevant Orders   Comp Met (CMET)   CBC with Differential/Platelet   TSH   Lipid panel   Other Visit Diagnoses     Encounter for routine gynecological examination with Papanicolaou smear of cervix       Relevant Orders   Cytology - PAP( [place])      No orders of the defined types were placed in this encounter.   I, Debbrah Alar, NP, personally preformed the services described in this documentation.  All medical record entries made by the scribe were at my direction and  in my presence.  I have reviewed the chart and discharge instructions (if applicable) and agree that the record reflects my personal performance and is accurate and complete. 06/07/2021  I,Lyric Barr-McArthur,acting as a Education administrator for Nance Pear, NP.,have documented all relevant documentation on the behalf of Nance Pear, NP,as directed by  Nance Pear, NP while in the presence of Nance Pear, NP.  Nance Pear, NP

## 2021-06-07 NOTE — Patient Instructions (Signed)
Please complete lab work prior to leaving.  Continue healthy diet and regular exercise.  

## 2021-06-14 LAB — CYTOLOGY - PAP
Comment: NEGATIVE
Diagnosis: NEGATIVE
High risk HPV: NEGATIVE

## 2021-07-18 DIAGNOSIS — Z1231 Encounter for screening mammogram for malignant neoplasm of breast: Secondary | ICD-10-CM | POA: Diagnosis not present

## 2021-07-18 LAB — HM MAMMOGRAPHY

## 2021-09-20 IMAGING — MG DIGITAL SCREENING BILAT W/ TOMO W/ CAD
8 series · 8 of 24 positions shown · non-contrast
Comparison: Previous exam(s).

CLINICAL DATA: Screening.

EXAM:
DIGITAL SCREENING BILATERAL MAMMOGRAM WITH TOMO AND CAD

[L CC synth-2D]
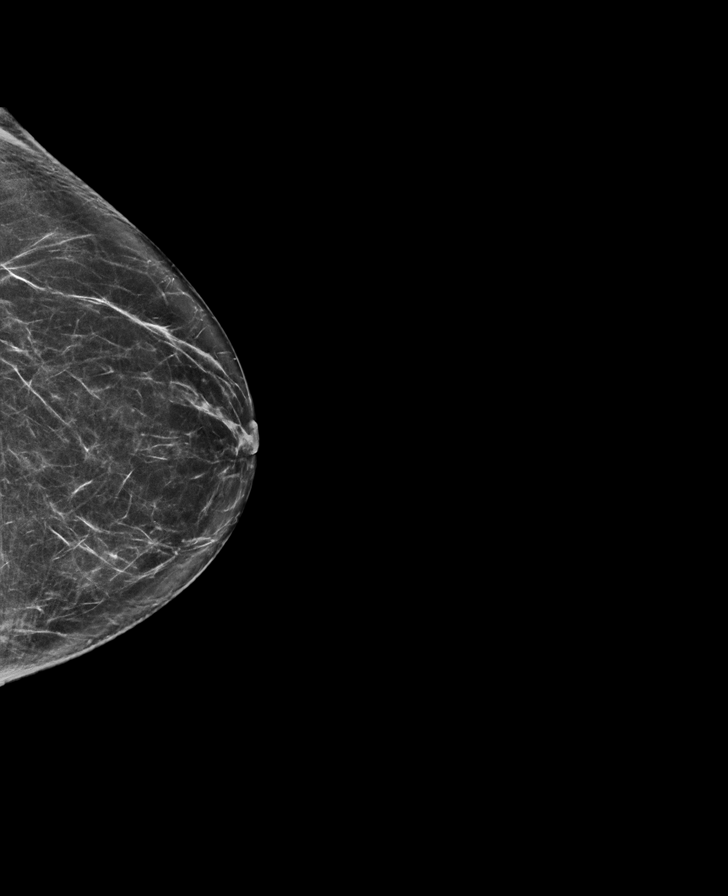

[R CC synth-2D]
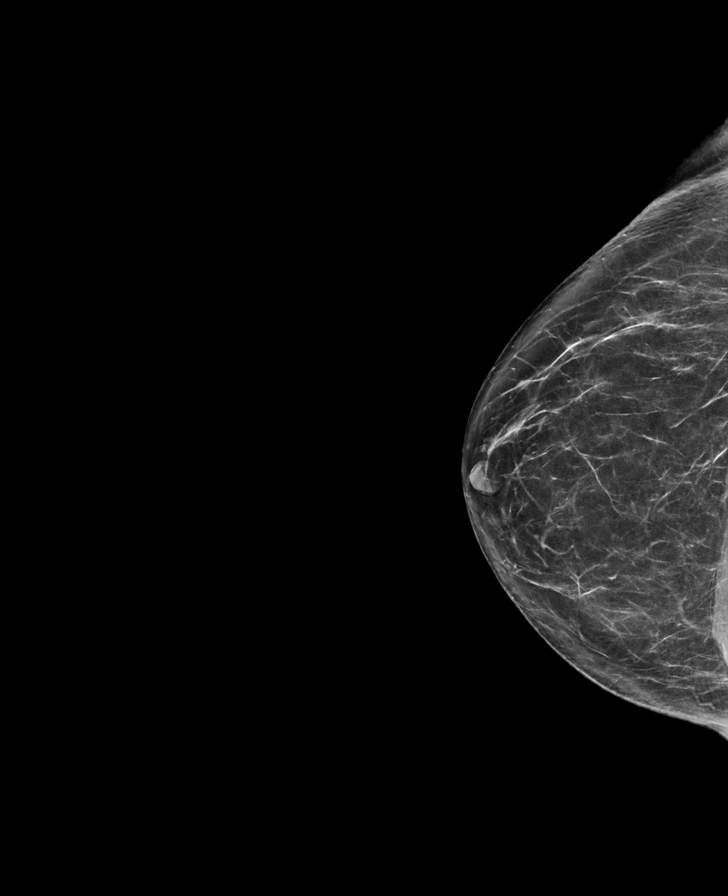

[R MLO synth-2D]
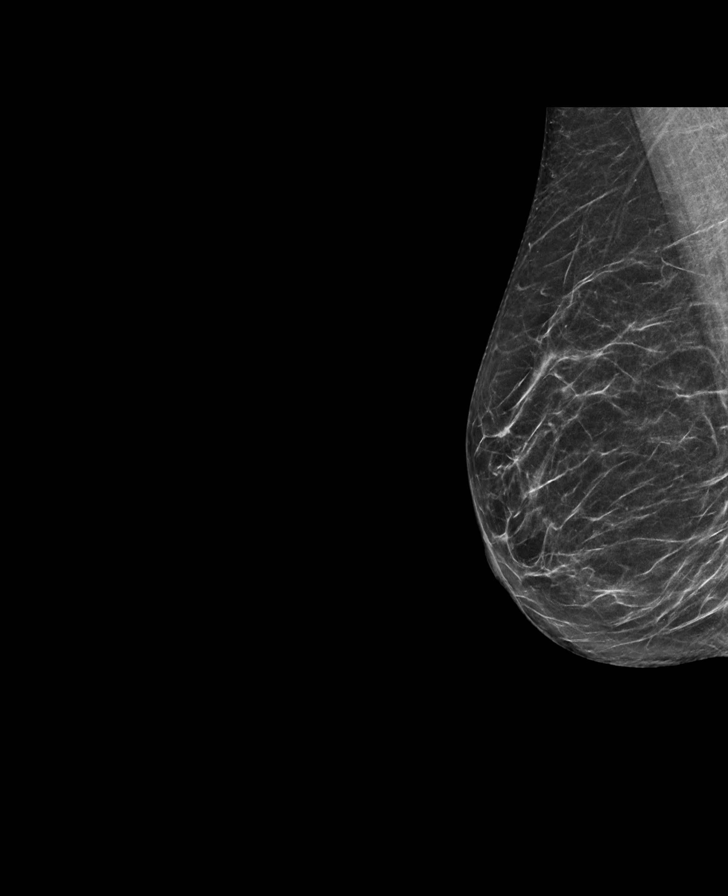

[L MLO synth-2D]
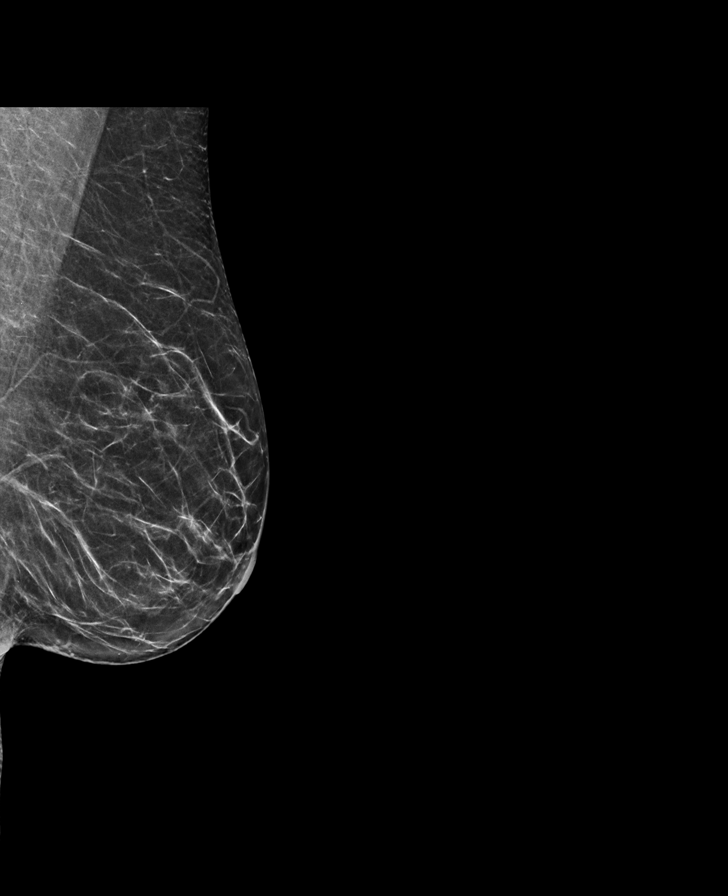

[L MLO tomo · tomo slice 31/60.0]
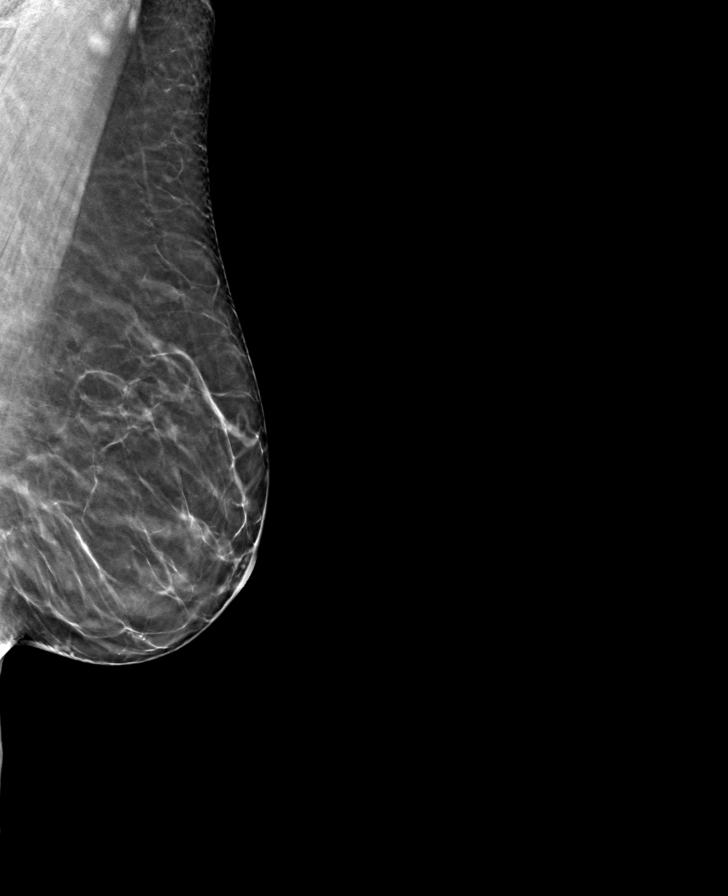

[R CC tomo · tomo slice 34/67.0]
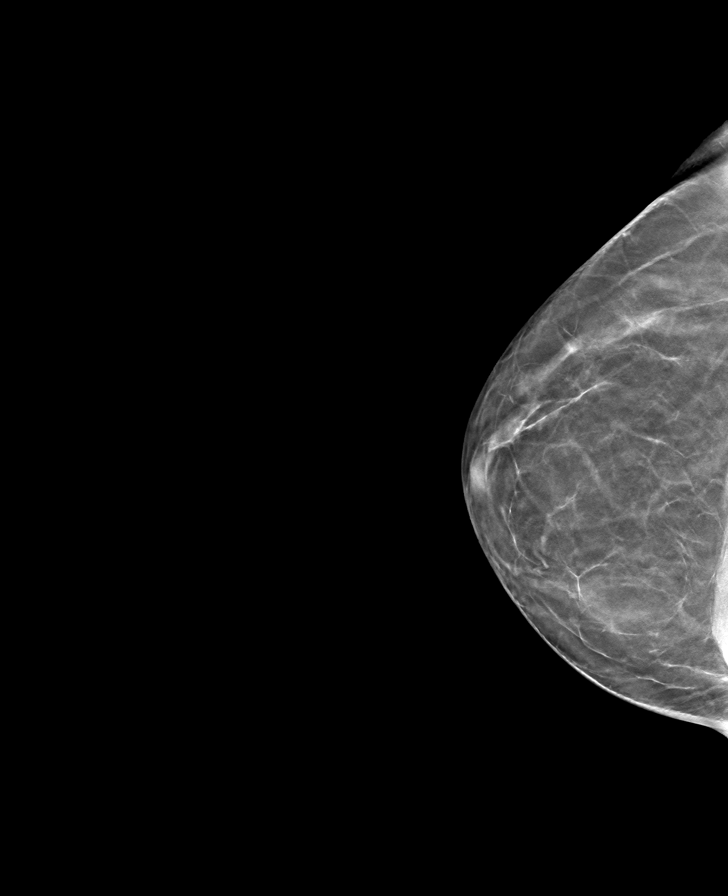

[L CC tomo · tomo slice 33/65.0]
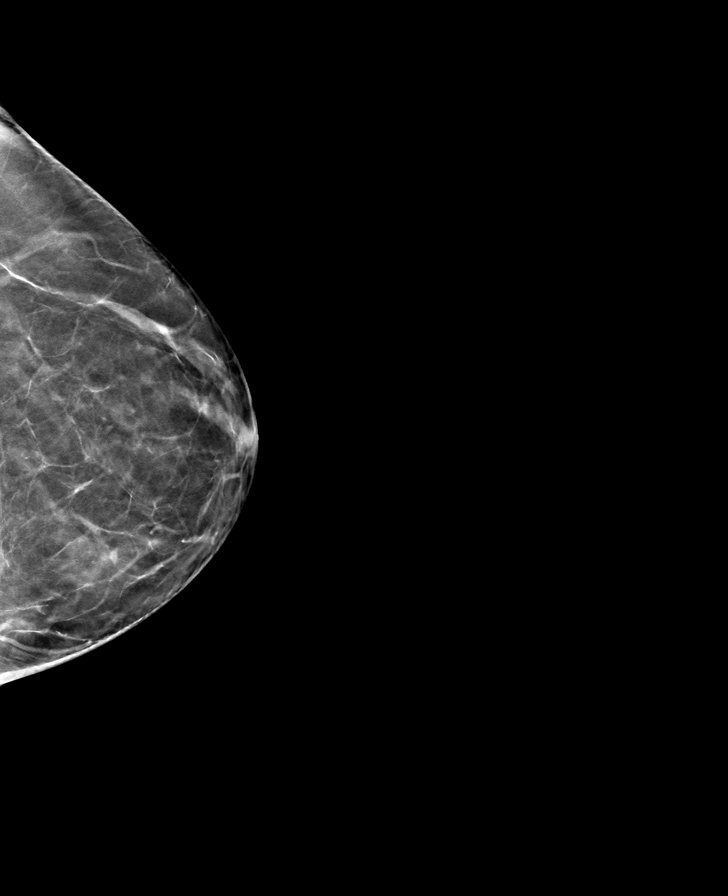

[R MLO tomo · tomo slice 32/63.0]
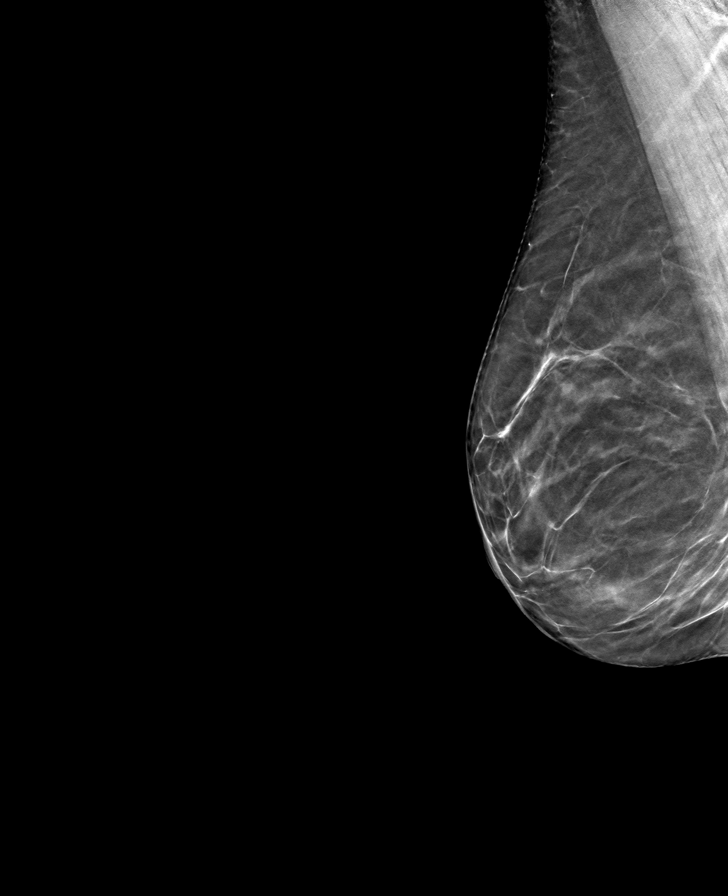

[8 of 24 positions shown; findings below may reference images not displayed]

ACR Breast Density Category b: There are scattered areas of
fibroglandular density.
FINDINGS: There are no findings suspicious for malignancy. Images were
processed with CAD.
IMPRESSION: No mammographic evidence of malignancy. A result letter of this
screening mammogram will be mailed directly to the patient.

RECOMMENDATION:
Screening mammogram in one year. (Code:[id number])

BI-RADS CATEGORY  1: Negative.

## 2022-06-09 ENCOUNTER — Ambulatory Visit (INDEPENDENT_AMBULATORY_CARE_PROVIDER_SITE_OTHER): Payer: 59 | Admitting: Family

## 2022-06-09 ENCOUNTER — Encounter: Payer: Self-pay | Admitting: Family

## 2022-06-09 VITALS — BP 138/84 | HR 103 | Temp 97.7°F | Resp 16 | Ht 68.0 in | Wt 147.0 lb

## 2022-06-09 DIAGNOSIS — Z1211 Encounter for screening for malignant neoplasm of colon: Secondary | ICD-10-CM

## 2022-06-09 DIAGNOSIS — Z0000 Encounter for general adult medical examination without abnormal findings: Secondary | ICD-10-CM | POA: Diagnosis not present

## 2022-06-09 DIAGNOSIS — M8580 Other specified disorders of bone density and structure, unspecified site: Secondary | ICD-10-CM

## 2022-06-09 LAB — COMPREHENSIVE METABOLIC PANEL
ALT: 17 U/L (ref 0–35)
AST: 21 U/L (ref 0–37)
Albumin: 4.4 g/dL (ref 3.5–5.2)
Alkaline Phosphatase: 72 U/L (ref 39–117)
BUN: 15 mg/dL (ref 6–23)
CO2: 28 mEq/L (ref 19–32)
Calcium: 9.4 mg/dL (ref 8.4–10.5)
Chloride: 104 mEq/L (ref 96–112)
Creatinine, Ser: 0.8 mg/dL (ref 0.40–1.20)
GFR: 77.91 mL/min (ref >60.00)
Glucose, Bld: 85 mg/dL (ref 70–99)
Potassium: 4.1 mEq/L (ref 3.5–5.1)
Sodium: 140 mEq/L (ref 135–145)
Total Bilirubin: 0.8 mg/dL (ref 0.2–1.2)
Total Protein: 7.4 g/dL (ref 6.0–8.3)

## 2022-06-09 LAB — LIPID PANEL
Cholesterol: 190 mg/dL (ref 0–200)
HDL: 49 mg/dL (ref >39.00)
LDL Cholesterol: 117 mg/dL — ABNORMAL HIGH (ref 0–99)
NonHDL: 141.14
Total CHOL/HDL Ratio: 4
Triglycerides: 119 mg/dL (ref 0.0–149.0)
VLDL: 23.8 mg/dL (ref 0.0–40.0)

## 2022-06-09 LAB — CBC WITH DIFFERENTIAL/PLATELET
Basophils Absolute: 0 10*3/uL (ref 0.0–0.1)
Basophils Relative: 0.7 % (ref 0.0–3.0)
Eosinophils Absolute: 0.1 10*3/uL (ref 0.0–0.7)
Eosinophils Relative: 1.6 % (ref 0.0–5.0)
HCT: 39.5 % (ref 36.0–46.0)
Hemoglobin: 13.2 g/dL (ref 12.0–15.0)
Lymphocytes Relative: 36.1 % (ref 12.0–46.0)
Lymphs Abs: 1.6 10*3/uL (ref 0.7–4.0)
MCHC: 33.4 g/dL (ref 30.0–36.0)
MCV: 88.1 fl (ref 78.0–100.0)
Monocytes Absolute: 0.5 10*3/uL (ref 0.1–1.0)
Monocytes Relative: 10.2 % (ref 3.0–12.0)
Neutro Abs: 2.3 10*3/uL (ref 1.4–7.7)
Neutrophils Relative %: 51.4 % (ref 43.0–77.0)
Platelets: 241 10*3/uL (ref 150.0–400.0)
RBC: 4.49 Mil/uL (ref 3.87–5.11)
RDW: 12.9 % (ref 11.5–15.5)
WBC: 4.6 10*3/uL (ref 4.0–10.5)

## 2022-06-09 LAB — TSH: TSH: 2.91 u[IU]/mL (ref 0.35–5.50)

## 2022-06-09 NOTE — Progress Notes (Signed)
Subjective:     Patient ID: Julia Carr, female    DOB: 06-15-1956, 66 y.o.   MRN: [id number]  Chief Complaint  Patient presents with   Annual Exam         HPI  Patient presents today for complete physical.  Immunizations: td 2022, declines flu shot, covid vaccine and shingrix Diet: healthy Wt Readings from Last 3 Encounters:  06/09/22 147 lb (66.7 kg)  06/07/21 149 lb (67.6 kg)  05/03/21 149 lb 1.3 oz (67.6 kg)  Exercise:  running 3x a week, yoga 6 nights a week, walks other nights Colonoscopy: 1/14 Dexa: 11/21 Pap Smear: 06/07/21 Mammogram: 07/31/21 Vision: up to date Dental: up to date  Health Maintenance Due  Topic Date Due   Hepatitis C Screening  Never done    Past Medical History:  Diagnosis Date   Anal skin tag    Atrial fibrillation ([place]) 07/13/2014   Essential hypertension 01/03/2015   H/O prior ablation treatment 08/03/2017   Formatting of this note might be different from the original. Cryo-balloon ablation of atrial fibrillation and radiofrequency ablation of atrial flutter December 2016   Hemorrhoids    History of pneumothorax    treated with chest tube   Labile hypertension    followed by cardiology (dr fitzgerald)   Loss of weight 07/13/2014   Neck pain 01/03/2015   Osteopenia 09/13/2014   PAF (paroxysmal atrial fibrillation) [facility])    cardiologist-  dr Shanon Brow fitzgerald [facility] in [facility])---  08/2015 s/p cryo balloon ablation for fib/flutter    Preventative health care 08/04/2014   Wears contact lenses     Past Surgical History:  Procedure Laterality Date   CARDIAC ELECTROPHYSIOLOGY STUDY AND ABLATION  12/ 2016    dr Shanon Brow fitzgerald  @[facility]    COLONOSCOPY  last one 2014 approx.   MASS EXCISION N/A 08/12/2018   Procedure: EXCISION OF ANAL SKINTAG;  Surgeon: Leighton Ruff, MD;  Location: [place];  Service: General;  Laterality: N/A;    Family History  Problem Relation Age of Onset   Cancer Mother         ovarian   Diabetes Mother        type II   Alcohol abuse Father    Hypertension Father    Hypertension Sister    Cancer Paternal Grandmother        breast   Heart disease Paternal Grandmother    Alcohol abuse Paternal Grandfather    Cancer Paternal Aunt        breast   Heart murmur Sister    Osteoarthritis Sister     Social History   Socioeconomic History   Marital status: Married    Spouse name: Not on file   Number of children: Not on file   Years of education: Not on file   Highest education level: Not on file  Occupational History   Not on file  Tobacco Use   Smoking status: Former    Packs/day: 0.50    Years: 25.00    Total pack years: 12.50    Types: Cigarettes    Quit date: 08/06/2008    Years since quitting: 13.8   Smokeless tobacco: Never  Vaping Use   Vaping Use: Never used  Substance and Sexual Activity   Alcohol use: Yes    Comment: occasional   Drug use: No   Sexual activity: Yes    Partners: Male  Other Topics Concern   Not on file  Social History Narrative   Daughter born 65   Married   Retired from [employer], horseback riding, hiking.    One dog      Social Determinants of Radio broadcast assistant Strain: Not on file  Food Insecurity: Not on file  Transportation Needs: Not on file  Physical Activity: Not on file  Stress: Not on file  Social Connections: Not on file  Intimate Partner Violence: Not on file    Outpatient Medications Prior to Visit  Medication Sig Dispense Refill   Calcium Carbonate-Vitamin D (CALTRATE 600+D) 600-400 MG-UNIT tablet Take 1 tablet by mouth 2 (two) times daily.     Multiple Vitamin (MULTIVITAMIN PO) Take by mouth daily.      No facility-administered medications prior to visit.    Allergies  Allergen Reactions   Latex Hives    Review of Systems  Constitutional:  Negative for weight loss.  HENT:  Negative for congestion and hearing loss.   Eyes:  Negative for blurred vision.   Respiratory:  Negative for cough.   Cardiovascular:  Negative for leg swelling.  Gastrointestinal:  Negative for blood in stool, constipation and diarrhea.  Genitourinary:  Negative for dysuria, frequency and hematuria.  Musculoskeletal:  Negative for joint pain and myalgias.  Skin:  Positive for rash.  Neurological:  Negative for headaches.  Psychiatric/Behavioral:         Denies depression/anxiety        Objective:    Physical Exam  BP 138/84   Pulse (!) 103   Temp 97.7 F (36.5 C) (Oral)   Resp 16   Ht 5' 8"  (1.727 m)   Wt 147 lb (66.7 kg)   SpO2 100%   BMI 22.35 kg/m  Wt Readings from Last 3 Encounters:  06/09/22 147 lb (66.7 kg)  06/07/21 149 lb (67.6 kg)  05/03/21 149 lb 1.3 oz (67.6 kg)   Physical Exam  Constitutional: She is oriented to person, place, and time. She appears well-developed and well-nourished. No distress.  HENT:  Head: Normocephalic and atraumatic.  Right Ear: Tympanic membrane and ear canal normal.  Left Ear: Tympanic membrane and ear canal normal.  Mouth/Throat: Oropharynx is clear and moist.  Eyes: Pupils are equal, round, and reactive to light. No scleral icterus.  Neck: Normal range of motion. No thyromegaly present.  Cardiovascular: Normal rate and regular rhythm.   No murmur heard. Pulmonary/Chest: Effort normal and breath sounds normal. No respiratory distress. He has no wheezes. She has no rales. She exhibits no tenderness.  Abdominal: Soft. Bowel sounds are normal. She exhibits no distension and no mass. There is no tenderness. There is no rebound and no guarding.  Musculoskeletal: She exhibits no edema.  Lymphadenopathy:    She has no cervical adenopathy.  Neurological: She is alert and oriented to person, place, and time. She has normal patellar reflexes. She exhibits normal muscle tone. Coordination normal.  Skin: Skin is warm and dry.  Psychiatric: She has a normal mood and affect. Her behavior is normal. Judgment and thought  content normal.  Breasts: Examined lying Right: Without masses, retractions, discharge or axillary adenopathy.  Left: Without masses, retractions, discharge or axillary adenopathy.      Assessment & Plan:   Problem List Items Addressed This Visit       Unprioritized   Preventative health care - Primary    Continue healthy diet and regular exercise. Will refer for colonoscopy. Mammo/pap up to date. Declines flu/covid/shingrix vaccines.  Refer for bone density test.   She would like a full lab panel drawn and knows that this may not be covered by insurance.       Relevant Orders   Ambulatory referral to Gastroenterology   Comp Met (CMET)   CBC with Differential/Platelet   TSH   Lipid panel   Osteopenia   Relevant Orders   DG Bone Density   Other Visit Diagnoses     Colon cancer screening           I am having Candiss Norse. Shimabukuro maintain her Multiple Vitamin (MULTIVITAMIN PO) and Calcium Carbonate-Vitamin D.  No orders of the defined types were placed in this encounter.

## 2022-06-09 NOTE — Assessment & Plan Note (Addendum)
Continue healthy diet and regular exercise. Will refer for colonoscopy. Mammo/pap up to date. Declines flu/covid/shingrix vaccines.  Refer for bone density test.   She would like a full lab panel drawn and knows that this may not be covered by insurance.

## 2022-06-23 NOTE — Addendum Note (Signed)
Addended by: Jiles Prows on: 06/23/2022 03:04 PM   Modules accepted: Orders

## 2022-07-29 ENCOUNTER — Ambulatory Visit (HOSPITAL_BASED_OUTPATIENT_CLINIC_OR_DEPARTMENT_OTHER)
Admission: RE | Admit: 2022-07-29 | Discharge: 2022-07-29 | Disposition: A | Discharge status: Home / Self Care | Payer: 59 | Source: Ambulatory Visit | Attending: Family | Admitting: Family

## 2022-07-29 ENCOUNTER — Encounter: Payer: Self-pay | Admitting: Family

## 2022-07-29 DIAGNOSIS — M8580 Other specified disorders of bone density and structure, unspecified site: Secondary | ICD-10-CM | POA: Diagnosis present

## 2022-07-29 DIAGNOSIS — Z1211 Encounter for screening for malignant neoplasm of colon: Secondary | ICD-10-CM

## 2022-08-15 NOTE — Addendum Note (Signed)
Addended by: Sandford Craze on: 08/15/2022 09:01 AM   Modules accepted: Orders

## 2022-09-10 LAB — COLOGUARD: Cologuard: NEGATIVE

## 2022-09-21 LAB — COLOGUARD: COLOGUARD: NEGATIVE

## 2023-03-10 ENCOUNTER — Encounter (INDEPENDENT_AMBULATORY_CARE_PROVIDER_SITE_OTHER): Payer: 59 | Admitting: Family

## 2023-03-10 DIAGNOSIS — A09 Infectious gastroenteritis and colitis, unspecified: Secondary | ICD-10-CM | POA: Insufficient documentation

## 2023-03-10 DIAGNOSIS — Z7184 Encounter for health counseling related to travel: Secondary | ICD-10-CM | POA: Diagnosis not present

## 2023-03-10 MED ORDER — CIPROFLOXACIN HCL 500 MG PO TABS: 500.0000 mg | ORAL_TABLET | Freq: Two times a day (BID) | ORAL | 0 refills | Status: AC

## 2023-03-10 NOTE — Telephone Encounter (Signed)

## 2023-03-11 MED ORDER — AZITHROMYCIN 500 MG PO TABS: ORAL_TABLET | ORAL | 0 refills | Status: DC

## 2023-03-11 NOTE — Addendum Note (Signed)
Addended by: Sandford Craze on: 03/11/2023 09:25 AM   Modules accepted: Orders

## 2023-06-12 ENCOUNTER — Encounter: Payer: 59 | Admitting: Family

## 2023-07-07 ENCOUNTER — Telehealth: Payer: Self-pay | Admitting: Family

## 2023-07-07 ENCOUNTER — Encounter: Payer: Self-pay | Admitting: Family

## 2023-07-07 ENCOUNTER — Ambulatory Visit (INDEPENDENT_AMBULATORY_CARE_PROVIDER_SITE_OTHER): Payer: Medicare Other | Admitting: Family

## 2023-07-07 VITALS — BP 121/88 | HR 64 | Temp 98.4°F | Resp 16 | Ht 68.0 in | Wt 145.0 lb

## 2023-07-07 DIAGNOSIS — I480 Paroxysmal atrial fibrillation: Secondary | ICD-10-CM | POA: Diagnosis not present

## 2023-07-07 DIAGNOSIS — M8580 Other specified disorders of bone density and structure, unspecified site: Secondary | ICD-10-CM | POA: Diagnosis not present

## 2023-07-07 DIAGNOSIS — Z0000 Encounter for general adult medical examination without abnormal findings: Secondary | ICD-10-CM | POA: Diagnosis not present

## 2023-07-07 DIAGNOSIS — Z1231 Encounter for screening mammogram for malignant neoplasm of breast: Secondary | ICD-10-CM

## 2023-07-07 DIAGNOSIS — I10 Essential (primary) hypertension: Secondary | ICD-10-CM

## 2023-07-07 LAB — CBC WITH DIFFERENTIAL/PLATELET
Basophils Absolute: 0 10*3/uL (ref 0.0–0.1)
Basophils Relative: 0.9 % (ref 0.0–3.0)
Eosinophils Absolute: 0.1 10*3/uL (ref 0.0–0.7)
Eosinophils Relative: 2.5 % (ref 0.0–5.0)
HCT: 40.5 % (ref 36.0–46.0)
Hemoglobin: 13.2 g/dL (ref 12.0–15.0)
Lymphocytes Relative: 35.5 % (ref 12.0–46.0)
Lymphs Abs: 1.5 10*3/uL (ref 0.7–4.0)
MCHC: 32.6 g/dL (ref 30.0–36.0)
MCV: 88.9 fL (ref 78.0–100.0)
Monocytes Absolute: 0.3 10*3/uL (ref 0.1–1.0)
Monocytes Relative: 8.2 % (ref 3.0–12.0)
Neutro Abs: 2.2 10*3/uL (ref 1.4–7.7)
Neutrophils Relative %: 52.9 % (ref 43.0–77.0)
Platelets: 270 10*3/uL (ref 150.0–400.0)
RBC: 4.55 Mil/uL (ref 3.87–5.11)
RDW: 13.9 % (ref 11.5–15.5)
WBC: 4.2 10*3/uL (ref 4.0–10.5)

## 2023-07-07 LAB — TSH: TSH: 3.43 u[IU]/mL (ref 0.35–5.50)

## 2023-07-07 LAB — LIPID PANEL
Cholesterol: 185 mg/dL (ref 0–200)
HDL: 49 mg/dL (ref >39.00)
LDL Cholesterol: 116 mg/dL — ABNORMAL HIGH (ref 0–99)
NonHDL: 135.87
Total CHOL/HDL Ratio: 4
Triglycerides: 99 mg/dL (ref 0.0–149.0)
VLDL: 19.8 mg/dL (ref 0.0–40.0)

## 2023-07-07 LAB — COMPREHENSIVE METABOLIC PANEL
ALT: 14 U/L (ref 0–35)
AST: 19 U/L (ref 0–37)
Albumin: 4.2 g/dL (ref 3.5–5.2)
Alkaline Phosphatase: 69 U/L (ref 39–117)
BUN: 19 mg/dL (ref 6–23)
CO2: 31 meq/L (ref 19–32)
Calcium: 9.3 mg/dL (ref 8.4–10.5)
Chloride: 104 meq/L (ref 96–112)
Creatinine, Ser: 0.85 mg/dL (ref 0.40–1.20)
GFR: 71.9 mL/min (ref >60.00)
Glucose, Bld: 86 mg/dL (ref 70–99)
Potassium: 4.6 meq/L (ref 3.5–5.1)
Sodium: 141 meq/L (ref 135–145)
Total Bilirubin: 0.7 mg/dL (ref 0.2–1.2)
Total Protein: 7.1 g/dL (ref 6.0–8.3)

## 2023-07-07 NOTE — Assessment & Plan Note (Addendum)
Last cardiology appointment 2022.  Requests follow up EKG's here instead of cardiology. EKG tracing is personally reviewed.  EKG notes NSR.  No acute changes.

## 2023-07-07 NOTE — Progress Notes (Signed)
Subjective:     Patient ID: Julia Carr, female    DOB: [birth year], 67 y.o.   MRN: [id number]  Chief Complaint  Patient presents with   Annual Exam    HPI  Discussed the use of AI scribe software for clinical note transcription with the patient, who gave verbal consent to proceed.  History of Present Illness   The patient presents for a routine check-up. She reports a higher than usual blood pressure reading at the office today, attributing it to poor sleep the previous night due to her husband's snoring. She maintains an active lifestyle, running three days a week, swimming two days a week, and lifting weights one day a week. She also reports a healthy diet, with occasional indulgences such as cheesecake.  The patient has a history of mild bone thinning, which she manages with dietary calcium and regular exercise. She has noticed an improvement in her hip health since she began engaging her leg more while running, following a previous hamstring injury.  The patient also reports a recent bout of poison ivy, but no other skin concerns. She denies any cough or cold symptoms, digestive concerns, urinary issues, unusual muscle or joint pain, frequent headaches, or current concerns about depression or anxiety. She has regular eye and dental exams, and her last mammogram was in 2022. She has not had any surgeries in the past year, and there have been no changes to her family medical history. She does not smoke or use drugs, and she consumes alcohol occasionally.     Home bp's 120's over 78.    Immunizations: td up to date. Declines flu shot declines shingrix, declines covid Diet: healthy Exercise: runs/gym/swimming Colonoscopy: 09/10/22 Dexa:  mild osteopenia 2023 Pap Smear:   2022 Mammogram: 11/22 Vision:  up to date Dental: up to date      Health Maintenance Due  Topic Date Due   Medicare Annual Wellness (AWV)  Never done   COVID-19 Vaccine (1) Never done   Hepatitis C  Screening  Never done   Zoster Vaccines- Shingrix (1 of 2) Never done   Pneumonia Vaccine 47+ Years old (1 of 1 - PCV) Never done    Past Medical History:  Diagnosis Date   Anal skin tag    Atrial fibrillation ([facility]) 07/13/2014   Essential hypertension 01/03/2015   H/O prior ablation treatment 08/03/2017   Formatting of this note might be different from the original. Cryo-balloon ablation of atrial fibrillation and radiofrequency ablation of atrial flutter December 2016   Hemorrhoids    History of pneumothorax    treated with chest tube   Labile hypertension    followed by cardiology (dr fitzgerald)   Loss of weight 07/13/2014   Neck pain 01/03/2015   Osteopenia 09/13/2014   PAF (paroxysmal atrial fibrillation) [facility]    cardiologist-  dr Onalee Hua fitzgerald [facility] in [facility])---  08/2015 s/p cryo balloon ablation for fib/flutter    Preventative health care 08/04/2014   Wears contact lenses     Past Surgical History:  Procedure Laterality Date   CARDIAC ELECTROPHYSIOLOGY STUDY AND ABLATION  12/ 2016    dr Onalee Hua fitzgerald  @[facility]    COLONOSCOPY  last one 2014 approx.   MASS EXCISION N/A 08/12/2018   Procedure: EXCISION OF ANAL SKINTAG;  Surgeon: Romie Levee, MD;  Location: [facility];  Service: General;  Laterality: N/A;    Family History  Problem Relation Age of Onset   Cancer Mother  ovarian   Diabetes Mother        type II   Alcohol abuse Father    Hypertension Father    Hypertension Sister    Cancer Paternal Grandmother        breast   Heart disease Paternal Grandmother    Alcohol abuse Paternal Grandfather    Cancer Paternal Aunt        breast   Heart murmur Sister    Osteoarthritis Sister     Social History   Socioeconomic History   Marital status: Married    Spouse name: Not on file   Number of children: Not on file   Years of education: Not on file   Highest education level: Not on file  Occupational History   Not on file   Tobacco Use   Smoking status: Former    Current packs/day: 0.00    Average packs/day: 0.5 packs/day for 25.0 years (12.5 ttl pk-yrs)    Types: Cigarettes    Start date: 08/07/1983    Quit date: 08/06/2008    Years since quitting: 14.9   Smokeless tobacco: Never  Vaping Use   Vaping status: Never Used  Substance and Sexual Activity   Alcohol use: Yes    Comment: occasional   Drug use: No   Sexual activity: Yes    Partners: Male  Other Topics Concern   Not on file  Social History Narrative   Daughter born 20   Married   Retired from [employer], horseback riding, hiking.    One dog      Social Determinants of Corporate investment banker Strain: Not on file  Food Insecurity: Not on file  Transportation Needs: Not on file  Physical Activity: Not on file  Stress: Not on file  Social Connections: Unknown (01/12/2022)   Received from [facility]   Social Network    Social Network: Not on file  Intimate Partner Violence: Unknown (12/04/2021)   Received from [facility], [facility]   HITS    Physically Hurt: Not on file    Insult or Talk Down To: Not on file    Threaten Physical Harm: Not on file    Scream or Curse: Not on file    Outpatient Medications Prior to Visit  Medication Sig Dispense Refill   azithromycin (ZITHROMAX) 500 MG tablet Take 2 tabs by mouth once as needed for traveller's diarrhea. 2 tablet 0   Calcium Carbonate-Vitamin D (CALTRATE 600+D) 600-400 MG-UNIT tablet Take 1 tablet by mouth 2 (two) times daily.     Multiple Vitamin (MULTIVITAMIN PO) Take by mouth daily.      No facility-administered medications prior to visit.    Allergies  Allergen Reactions   Latex Hives    Review of Systems  Constitutional:  Negative for weight loss.  HENT:  Negative for congestion and hearing loss.   Eyes:  Negative for blurred vision.  Respiratory:  Negative for cough.   Cardiovascular:  Negative for chest pain and  palpitations.  Gastrointestinal:  Negative for constipation and diarrhea.  Genitourinary:  Negative for dysuria and urgency.  Musculoskeletal:  Negative for joint pain and myalgias.  Skin:  Positive for rash (poison ivy, just resolved from arm).  Neurological:  Negative for headaches.  Psychiatric/Behavioral:         Denies depression/anxiety       Objective:    Physical Exam   BP (!) 141/95 (BP Location: Left Arm, Patient Position:  Sitting, Cuff Size: Small)   Pulse 64   Temp 98.4 F (36.9 C) (Oral)   Resp 16   Ht 5\' 8"  (1.727 m)   Wt 145 lb (65.8 kg)   SpO2 100%   BMI 22.05 kg/m  Wt Readings from Last 3 Encounters:  07/07/23 145 lb (65.8 kg)  06/09/22 147 lb (66.7 kg)  06/07/21 149 lb (67.6 kg)   Physical Exam  Constitutional: She is oriented to person, place, and time. She appears well-developed and well-nourished. No distress.  HENT:  Head: Normocephalic and atraumatic.  Right Ear: Tympanic membrane and ear canal normal.  Left Ear: Tympanic membrane and ear canal normal.  Mouth/Throat: Oropharynx is clear and moist.  Eyes: Pupils are equal, round, and reactive to light. No scleral icterus.  Neck: Normal range of motion. No thyromegaly present.  Cardiovascular: Normal rate and regular rhythm.   No murmur heard. Pulmonary/Chest: Effort normal and breath sounds normal. No respiratory distress. He has no wheezes. She has no rales. She exhibits no tenderness.  Abdominal: Soft. Bowel sounds are normal. She exhibits no distension and no mass. There is no tenderness. There is no rebound and no guarding.  Musculoskeletal: She exhibits no edema.  Lymphadenopathy:    She has no cervical adenopathy.  Neurological: She is alert and oriented to person, place, and time. She has normal patellar reflexes. She exhibits normal muscle tone. Coordination normal.  Skin: Skin is warm and dry.  Psychiatric: She has a normal mood and affect. Her behavior is normal. Judgment and thought  content normal.  Breast/Pelvic: deferred        Assessment & Plan:       Assessment & Plan:   Problem List Items Addressed This Visit       Unprioritized   Preventative health care     -Continue regular eye and dental exams. -Continue current exercise regimen and healthy diet. -Repeat comprehensive lab panel, patient agrees to pay out-of-pocket if not covered by insurance.      Relevant Orders   Comp Met (CMET) (Completed)   CBC w/Diff (Completed)   Lipid panel (Completed)   TSH (Completed)   PAF (paroxysmal atrial fibrillation) [facility])    Last cardiology appointment 2022.  Requests follow up EKG's here instead of cardiology. EKG tracing is personally reviewed.  EKG notes NSR.  No acute changes.        Relevant Orders   EKG 12-Lead (Completed)   Osteopenia    Continue adequate calcium intake. Repeat dexa in 1-2 years. Cologard up to date.  Declines immunizations other than tetanus which is up to date.       Essential hypertension    BP mildly elevated here in office. Pt reports much better readings at home.  I have asked her to send her home reading later today.       Other Visit Diagnoses     Breast cancer screening by mammogram    -  Primary   Relevant Orders   MM 3D SCREENING MAMMOGRAM BILATERAL BREAST       I have discontinued Davonna Belling. Louks's Multiple Vitamin (MULTIVITAMIN PO), Calcium Carbonate-Vitamin D, and azithromycin.  No orders of the defined types were placed in this encounter.

## 2023-07-07 NOTE — Assessment & Plan Note (Addendum)
BP mildly elevated here in office. Pt reports much better readings at home.  I have asked her to send her home reading later today.

## 2023-07-07 NOTE — Telephone Encounter (Signed)
See mychart.  

## 2023-07-07 NOTE — Assessment & Plan Note (Signed)
-  Continue regular eye and dental exams. -Continue current exercise regimen and healthy diet. -Repeat comprehensive lab panel, patient agrees to pay out-of-pocket if not covered by insurance.

## 2023-07-07 NOTE — Assessment & Plan Note (Signed)
Continue adequate calcium intake. Repeat dexa in 1-2 years. Cologard up to date.  Declines immunizations other than tetanus which is up to date.

## 2023-07-07 NOTE — Patient Instructions (Signed)
VISIT SUMMARY:  During your routine check-up, we discussed your elevated blood pressure, bone density, breast cancer screening, and atrial fibrillation. You maintain an active lifestyle and a healthy diet, which is commendable. We also reviewed your general health maintenance, including regular eye and dental exams, and your exercise regimen.  YOUR PLAN:  -HYPERTENSION: Hypertension, or high blood pressure, can be influenced by factors like stress and poor sleep. Your blood pressure was elevated today, likely due to poor sleep. We will recheck it after you rest today. Please continue with your current management and lifestyle modifications.  -BONE DENSITY: Mild bone thinning, or osteopenia, means your bones are weaker than normal but not to the extent of osteoporosis. Continue your current lifestyle modifications, including your active exercise routine and calcium-rich diet.  -BREAST CANCER SCREENING: Regular mammograms are important for early detection of breast cancer. Your last mammogram in 2022 was normal. We will order another mammogram for you to schedule at your convenience.  -ATRIAL FIBRILLATION: Atrial fibrillation is an irregular and often rapid heart rate that can increase your risk of strokes and other heart-related complications. We will perform an EKG today.  -GENERAL HEALTH MAINTENANCE: Continue with your regular eye and dental exams, your current exercise regimen, and healthy diet. We will repeat a comprehensive lab panel, and you have agreed to pay out-of-pocket if it is not covered by insurance.  INSTRUCTIONS:  Please recheck your blood pressure after resting today. Schedule your mammogram at your convenience. Follow up with your cardiologist for atrial fibrillation management. Continue with your regular eye and dental exams, exercise routine, and healthy diet. We will repeat a comprehensive lab panel, and you have agreed to pay out-of-pocket if it is not covered by insurance.

## 2024-02-02 ENCOUNTER — Telehealth: Payer: Self-pay | Admitting: Family

## 2024-02-02 NOTE — Telephone Encounter (Signed)
 Copied from CRM [id number]. Topic: Medicare AWV >> Feb 02, 2024  9:38 AM [name] wrote: Reason for CRM: LVM 02/03/2024 to schedule AWV. Please schedule Virtual or Telehealth visits ONLY.   Julia Carr; Care Guide Ambulatory Clinical Support [place] [facility] Direct Dial: [phone]

## 2024-07-08 ENCOUNTER — Encounter: Payer: Self-pay | Admitting: Family

## 2024-07-08 ENCOUNTER — Ambulatory Visit: Payer: Self-pay | Admitting: Family

## 2024-07-08 ENCOUNTER — Ambulatory Visit: Payer: Medicare Other | Admitting: Family

## 2024-07-08 VITALS — BP 133/87 | HR 70 | Temp 97.7°F | Resp 16 | Ht 68.0 in | Wt 147.8 lb

## 2024-07-08 DIAGNOSIS — Z0000 Encounter for general adult medical examination without abnormal findings: Secondary | ICD-10-CM

## 2024-07-08 DIAGNOSIS — Z1231 Encounter for screening mammogram for malignant neoplasm of breast: Secondary | ICD-10-CM

## 2024-07-08 LAB — CBC WITH DIFFERENTIAL/PLATELET
Basophils Absolute: 0 K/uL (ref 0.0–0.1)
Basophils Relative: 0.3 % (ref 0.0–3.0)
Eosinophils Absolute: 0.2 K/uL (ref 0.0–0.7)
Eosinophils Relative: 4.2 % (ref 0.0–5.0)
HCT: 40.1 % (ref 36.0–46.0)
Hemoglobin: 13.3 g/dL (ref 12.0–15.0)
Lymphocytes Relative: 28.8 % (ref 12.0–46.0)
Lymphs Abs: 1.2 K/uL (ref 0.7–4.0)
MCHC: 33.3 g/dL (ref 30.0–36.0)
MCV: 87.7 fl (ref 78.0–100.0)
Monocytes Absolute: 0.4 K/uL (ref 0.1–1.0)
Monocytes Relative: 10.2 % (ref 3.0–12.0)
Neutro Abs: 2.4 K/uL (ref 1.4–7.7)
Neutrophils Relative %: 56.5 % (ref 43.0–77.0)
Platelets: 279 K/uL (ref 150.0–400.0)
RBC: 4.57 Mil/uL (ref 3.87–5.11)
RDW: 13.4 % (ref 11.5–15.5)
WBC: 4.3 K/uL (ref 4.0–10.5)

## 2024-07-08 LAB — COMPREHENSIVE METABOLIC PANEL WITH GFR
ALT: 16 U/L (ref 0–35)
AST: 22 U/L (ref 0–37)
Albumin: 4.3 g/dL (ref 3.5–5.2)
Alkaline Phosphatase: 81 U/L (ref 39–117)
BUN: 19 mg/dL (ref 6–23)
CO2: 30 meq/L (ref 19–32)
Calcium: 9.3 mg/dL (ref 8.4–10.5)
Chloride: 103 meq/L (ref 96–112)
Creatinine, Ser: 0.73 mg/dL (ref 0.40–1.20)
GFR: 85.7 mL/min (ref >60.00)
Glucose, Bld: 83 mg/dL (ref 70–99)
Potassium: 4.2 meq/L (ref 3.5–5.1)
Sodium: 141 meq/L (ref 135–145)
Total Bilirubin: 0.7 mg/dL (ref 0.2–1.2)
Total Protein: 7.2 g/dL (ref 6.0–8.3)

## 2024-07-08 LAB — LIPID PANEL
Cholesterol: 198 mg/dL (ref 0–200)
HDL: 51.6 mg/dL (ref >39.00)
LDL Cholesterol: 128 mg/dL — ABNORMAL HIGH (ref 0–99)
NonHDL: 145.94
Total CHOL/HDL Ratio: 4
Triglycerides: 89 mg/dL (ref 0.0–149.0)
VLDL: 17.8 mg/dL (ref 0.0–40.0)

## 2024-07-08 LAB — TSH: TSH: 2.58 u[IU]/mL (ref 0.35–5.50)

## 2024-07-08 NOTE — Assessment & Plan Note (Addendum)
  Routine visit with no acute concerns. Blood pressure and weight well-managed. Normal labs last year. Regular physical activity noted. - Ordered mammogram. - Cologuard up to date - Offered metabolic panel, blood count, cholesterol, and thyroid  tests.

## 2024-07-08 NOTE — Progress Notes (Signed)
 Subjective:     Patient ID: Julia Carr, female    DOB: 06/19/1956, 68 y.o.   MRN: [id number]  Chief Complaint  Patient presents with   Annual Exam    HPI  Discussed the use of AI scribe software for clinical note transcription with the patient, who gave verbal consent to proceed.  History of Present Illness   Julia Carr is a 68 year old female who presents for an annual physical exam.  She maintains an active lifestyle, engaging in running, swimming, walking, and horseback riding. Recently, she experienced hamstring tightness after a horseback riding lesson. In 2023, she was diagnosed with osteopenia following a period of inactivity due to a hamstring tear. She focuses on rebuilding strength and ensuring adequate calcium  and vitamin D  intake.  Her father recently experienced a mini-stroke at 75, but both parents are otherwise in good health. She plans to visit them in [place]  soon.  She reports no changes in weight, skin concerns, hearing or vision issues beyond needing glasses, swelling in her legs, respiratory symptoms, digestive issues, urinary problems, unusual muscle or joint pain unrelated to exercise, frequent headaches, or mental health concerns. She maintains a stable weight and monitors her heart rate during exercise, feeling she has the best life due to her active lifestyle.  Immunizations: tdap up to date, declines shingrix/prevnar/flu shot Diet: healthy Exercise: enjoys running 3 days a week, weights, riding horses Colonoscopy: 1/24 due 1/27 Dexa: declines dexa, wants to do next week Pap Smear: aged out Mammogram:  due Vision: up to date Dental: up to date     Health Maintenance Due  Topic Date Due   Medicare Annual Wellness (AWV)  Never done   COVID-19 Vaccine (1) Never done   Hepatitis C Screening  Never done   Zoster Vaccines- Shingrix (1 of 2) Never done   Pneumococcal Vaccine: 50+ Years (1 of 1 - PCV) Never done   Mammogram  07/19/2023     Past Medical History:  Diagnosis Date   Anal skin tag    Atrial fibrillation (HCC) 07/13/2014   Essential hypertension 01/03/2015   H/O prior ablation treatment 08/03/2017   Formatting of this note might be different from the original. Cryo-balloon ablation of atrial fibrillation and radiofrequency ablation of atrial flutter December 2016   Hemorrhoids    History of pneumothorax    treated with chest tube   Labile hypertension    followed by cardiology (dr fitzgerald)   Loss of weight 07/13/2014   Neck pain 01/03/2015   Osteopenia 09/13/2014   PAF (paroxysmal atrial fibrillation) [facility])    cardiologist-  dr alm fitzgerald [facility] in [facility])---  08/2015 s/p cryo balloon ablation for fib/flutter    Preventative health care 08/04/2014   Wears contact lenses     Past Surgical History:  Procedure Laterality Date   CARDIAC ELECTROPHYSIOLOGY STUDY AND ABLATION  12/ 2016    dr alm fitzgerald  @[facility]    COLONOSCOPY  last one 2014 approx.   MASS EXCISION N/A 08/12/2018   Procedure: EXCISION OF ANAL SKINTAG;  Surgeon: Debby Hila, MD;  Location: [facility];  Service: General;  Laterality: N/A;    Family History  Problem Relation Age of Onset   Cancer Mother        ovarian   Diabetes Mother        type II   Alcohol abuse Father    Hypertension Father    Transient ischemic attack Father  Hypertension Sister    Heart murmur Sister    Osteoarthritis Sister    Cancer Paternal Grandmother        breast   Heart disease Paternal Grandmother    Alcohol abuse Paternal Grandfather    Cancer Paternal Aunt        breast    Social History   Socioeconomic History   Marital status: Married    Spouse name: Not on file   Number of children: Not on file   Years of education: Not on file   Highest education level: Not on file  Occupational History   Not on file  Tobacco Use   Smoking status: Former    Current packs/day: 0.00    Average packs/day: 0.5  packs/day for 25.0 years (12.5 ttl pk-yrs)    Types: Cigarettes    Start date: 08/07/1983    Quit date: 08/06/2008    Years since quitting: 15.9   Smokeless tobacco: Never  Vaping Use   Vaping status: Never Used  Substance and Sexual Activity   Alcohol use: Yes    Comment: occasional   Drug use: No   Sexual activity: Yes    Partners: Male  Other Topics Concern   Not on file  Social History Narrative   Daughter born 72   Married   Retired from [employer], horseback riding, hiking.    One dog      Social Drivers of Corporate Investment Banker Strain: Not on file  Food Insecurity: Not on file  Transportation Needs: Not on file  Physical Activity: Not on file  Stress: Not on file  Social Connections: Unknown (01/12/2022)   Received from [facility]   Social Network    Social Network: Not on file  Intimate Partner Violence: Unknown (12/04/2021)   Received from [facility]   HITS    Physically Hurt: Not on file    Insult or Talk Down To: Not on file    Threaten Physical Harm: Not on file    Scream or Curse: Not on file    No outpatient medications prior to visit.   No facility-administered medications prior to visit.    Allergies  Allergen Reactions   Latex Hives    Review of Systems  Constitutional:  Negative for weight loss.  HENT:  Negative for congestion and hearing loss.   Eyes:  Negative for blurred vision.  Respiratory:  Negative for cough.   Cardiovascular:  Negative for leg swelling.  Gastrointestinal:  Negative for constipation and diarrhea.  Genitourinary:  Negative for dysuria and frequency.  Musculoskeletal:  Negative for joint pain and myalgias.  Skin:  Negative for rash.  Neurological:  Negative for headaches.  Psychiatric/Behavioral:  Negative for depression. The patient is not nervous/anxious.        Objective:    Physical Exam   BP 133/87 (BP Location: Left Arm, Patient Position: Sitting, Cuff Size: Normal)   Pulse  70   Temp 97.7 F (36.5 C) (Oral)   Resp 16   Ht 5' 8 (1.727 m)   Wt 147 lb 12.8 oz (67 kg)   SpO2 100%   BMI 22.47 kg/m  Wt Readings from Last 3 Encounters:  07/08/24 147 lb 12.8 oz (67 kg)  07/07/23 145 lb (65.8 kg)  06/09/22 147 lb (66.7 kg)   Physical Exam  Constitutional: She is oriented to person, place, and time. She appears well-developed and well-nourished. No distress.  HENT:  Head: Normocephalic and  atraumatic.  Right Ear: Tympanic membrane and ear canal normal.  Left Ear: Tympanic membrane and ear canal normal.  Mouth/Throat: Oropharynx is clear and moist.  Eyes: Pupils are equal, round, and reactive to light. No scleral icterus.  Neck: Normal range of motion. No thyromegaly present.  Cardiovascular: Normal rate and regular rhythm.   No murmur heard. Pulmonary/Chest: Effort normal and breath sounds normal. No respiratory distress. He has no wheezes. She has no rales. She exhibits no tenderness.  Abdominal: Soft. Bowel sounds are normal. She exhibits no distension and no mass. There is no tenderness. There is no rebound and no guarding.  Musculoskeletal: She exhibits no edema.  Lymphadenopathy:    She has no cervical adenopathy.  Neurological: She is alert and oriented to person, place, and time. She has normal patellar reflexes. She exhibits normal muscle tone. Coordination normal.  Skin: Skin is warm and dry.  Psychiatric: She has a normal mood and affect. Her behavior is normal. Judgment and thought content normal.  Breast/Pelvic: deferred           Assessment & Plan:       Assessment & Plan:   Problem List Items Addressed This Visit       Unprioritized   Preventative health care    Routine visit with no acute concerns. Blood pressure and weight well-managed. Normal labs last year. Regular physical activity noted. - Ordered mammogram. - Cologuard up to date - Offered metabolic panel, blood count, cholesterol, and thyroid  tests.       Relevant  Orders   Comp Met (CMET)   CBC w/Diff   TSH   Lipid panel   Other Visit Diagnoses       Breast cancer screening by mammogram    -  Primary   Relevant Orders   MM 3D SCREENING MAMMOGRAM BILATERAL BREAST       Julia Carr does not currently have medications on file.  No orders of the defined types were placed in this encounter.

## 2024-07-08 NOTE — Patient Instructions (Signed)
 VISIT SUMMARY:  Today, you had your annual physical exam. You are maintaining an active lifestyle and managing your health well. We discussed your osteopenia and general health maintenance, and you have no acute concerns at this time.  YOUR PLAN:  ADULT WELLNESS VISIT: Routine visit with no acute concerns. Your blood pressure and weight are well-managed, and your labs from last year were normal. -A mammogram has been ordered. -You were offered tests for metabolic panel, blood count, cholesterol, and thyroid .  OSTEOPENIA: You have osteopenia, but you are focusing on rebuilding strength and ensuring adequate calcium  and vitamin D  intake. -You opted to delay the bone density scan until 2026.  GENERAL HEALTH MAINTENANCE: Your immunizations are current, and you declined the shingles and pneumonia vaccines. Your vision and dental check-ups are up to date. We noted your father's recent mini-stroke. -Continue your current exercise regimen. -Ensure you are getting enough calcium  and vitamin D .

## 2024-08-30 ENCOUNTER — Telehealth: Payer: Self-pay | Admitting: Family

## 2024-08-30 NOTE — Telephone Encounter (Signed)
 Copied from CRM #[id number]. Topic: Medicare AWV >> Aug 30, 2024  2:17 PM Nathanel DEL wrote: Called LVM 08/30/2024 to sched AWVI. Please schedule AWVI in office.   Nathanel Paschal; Care Guide Ambulatory Clinical Support [place] [facility] Direct Dial: [phone]

## 2025-07-07 ENCOUNTER — Encounter: Admitting: Family
